# Patient Record
Sex: Female | Born: 2000 | Race: White | Hispanic: No | Marital: Single | Smoking: Former smoker
Health system: Southern US, Community
[De-identification: ages and names within clinical notes are randomized; demographics above are authoritative.]

## PROBLEM LIST (undated history)

## (undated) DIAGNOSIS — F419 Anxiety disorder, unspecified: Secondary | ICD-10-CM

## (undated) HISTORY — PX: NO PAST SURGERIES: SHX2092

---
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
## 2020-01-08 ENCOUNTER — Ambulatory Visit (INDEPENDENT_AMBULATORY_CARE_PROVIDER_SITE_OTHER): Payer: Self-pay | Admitting: Otolaryngology

## 2020-01-08 DIAGNOSIS — J0391 Acute recurrent tonsillitis, unspecified: Secondary | ICD-10-CM

## 2020-01-08 NOTE — H&P (Signed)
PREOPERATIVE H&P  Chief Complaint: History of recurrent tonsillitis  HPI: Michelle Merritt is a 19 y.o. female who presents for evaluation of history of recurrent tonsillitis.  Patient was initially supposed to have tonsillectomy performed 2 years ago but this was delayed because of Covid.  She has continued to have history of recurrent sore throats when if she gets sick and is taken to the operating room at this time for tonsillectomy.  Past Medical History:  Diagnosis Date  . Anxiety    No past surgical history on file. Social History   Socioeconomic History  . Marital status: Single    Spouse name: Not on file  . Number of children: Not on file  . Years of education: Not on file  . Highest education level: Not on file  Occupational History  . Not on file  Tobacco Use  . Smoking status: Never Smoker  . Smokeless tobacco: Never Used  Vaping Use  . Vaping Use: Never used  Substance and Sexual Activity  . Alcohol use: Yes    Comment: soc  . Drug use: Not Currently  . Sexual activity: Not on file  Other Topics Concern  . Not on file  Social History Narrative  . Not on file   Social Determinants of Health   Financial Resource Strain:   . Difficulty of Paying Living Expenses: Not on file  Food Insecurity:   . Worried About Programme researcher, broadcasting/film/video in the Last Year: Not on file  . Ran Out of Food in the Last Year: Not on file  Transportation Needs:   . Lack of Transportation (Medical): Not on file  . Lack of Transportation (Non-Medical): Not on file  Physical Activity:   . Days of Exercise per Week: Not on file  . Minutes of Exercise per Session: Not on file  Stress:   . Feeling of Stress : Not on file  Social Connections:   . Frequency of Communication with Friends and Family: Not on file  . Frequency of Social Gatherings with Friends and Family: Not on file  . Attends Religious Services: Not on file  . Active Member of Clubs or Organizations: Not on file  . Attends  Banker Meetings: Not on file  . Marital Status: Not on file   No family history on file. Allergies  Allergen Reactions  . Amoxicillin Hives  . Penicillins Hives  . Dextromethorphan Other (See Comments)  . Phenylephrine Other (See Comments)   Prior to Admission medications   Medication Sig Start Date End Date Taking? Authorizing Provider  ALPRAZolam (XANAX) 0.5 MG tablet alprazolam 0.5 mg tablet  TAKE 1/2 TO 1 TABLET BY MOUTH EVERY 6 HOURS AS NEEDED ANXIETY    [provider]  citalopram (CELEXA) 10 MG tablet citalopram 10 mg tablet  TAKE 2 TABLETS BY MOUTH EVERY MORNING    [provider]  Levonorgestrel (KYLEENA) 19.5 MG IUD Kyleena 17.5 mcg/24 hrs (70yrs) 19.5mg  intrauterine device  Take 1 device by intrauterine route.    [provider]  propranolol (INDERAL) 20 MG tablet propranolol 20 mg tablet  TAKE 1 TO 2 TABLETS BY MOUTH EVERY 6 HOURS AS NEEDED FOR ANXIETY    [provider]  SUMAtriptan (IMITREX) 100 MG tablet sumatriptan 100 mg tablet  TAKE 1/2 1 TABLET AS NEEDED FOR MIGRAINE. MAY REPEAT IN 2 HRS AS NEEDED DO NOT EXCEED 2 IN 24 HOURSD    [provider]     Positive ROS: Otherwise negative  All other systems have been reviewed and were otherwise negative with the exception of those mentioned in the HPI and as above.  Physical Exam: There were no vitals filed for this visit.  General: Alert, no acute distress Oral: Normal oral mucosa and tonsils 2+ with no acute exudate. Nasal: Clear nasal passages Neck: No palpable adenopathy or thyroid nodules Ear: Ear canal is clear with normal appearing TMs Cardiovascular: Regular rate and rhythm, no murmur.  Respiratory: Clear to auscultation Neurologic: Alert and oriented x 3   Assessment/Plan: Recurrent tonsil infections  Plan for tonsillectomy   [name], MD 01/08/2020 4:22 PM

## 2020-01-08 NOTE — H&P (View-Only) (Signed)
PREOPERATIVE H&P  Chief Complaint: History of recurrent tonsillitis  HPI: Michelle Merritt is a 19 y.o. female who presents for evaluation of history of recurrent tonsillitis.  Patient was initially supposed to have tonsillectomy performed 2 years ago but this was delayed because of Covid.  She has continued to have history of recurrent sore throats when if she gets sick and is taken to the operating room at this time for tonsillectomy.  Past Medical History:  Diagnosis Date  . Anxiety    No past surgical history on file. Social History   Socioeconomic History  . Marital status: Single    Spouse name: Not on file  . Number of children: Not on file  . Years of education: Not on file  . Highest education level: Not on file  Occupational History  . Not on file  Tobacco Use  . Smoking status: Never Smoker  . Smokeless tobacco: Never Used  Vaping Use  . Vaping Use: Never used  Substance and Sexual Activity  . Alcohol use: Yes    Comment: soc  . Drug use: Not Currently  . Sexual activity: Not on file  Other Topics Concern  . Not on file  Social History Narrative  . Not on file   Social Determinants of Health   Financial Resource Strain:   . Difficulty of Paying Living Expenses: Not on file  Food Insecurity:   . Worried About Programme researcher, broadcasting/film/video in the Last Year: Not on file  . Ran Out of Food in the Last Year: Not on file  Transportation Needs:   . Lack of Transportation (Medical): Not on file  . Lack of Transportation (Non-Medical): Not on file  Physical Activity:   . Days of Exercise per Week: Not on file  . Minutes of Exercise per Session: Not on file  Stress:   . Feeling of Stress : Not on file  Social Connections:   . Frequency of Communication with Friends and Family: Not on file  . Frequency of Social Gatherings with Friends and Family: Not on file  . Attends Religious Services: Not on file  . Active Member of Clubs or Organizations: Not on file  . Attends  Banker Meetings: Not on file  . Marital Status: Not on file   No family history on file. Allergies  Allergen Reactions  . Amoxicillin Hives  . Penicillins Hives  . Dextromethorphan Other (See Comments)  . Phenylephrine Other (See Comments)   Prior to Admission medications   Medication Sig Start Date End Date Taking? Authorizing Provider  ALPRAZolam (XANAX) 0.5 MG tablet alprazolam 0.5 mg tablet  TAKE 1/2 TO 1 TABLET BY MOUTH EVERY 6 HOURS AS NEEDED ANXIETY    [provider]  citalopram (CELEXA) 10 MG tablet citalopram 10 mg tablet  TAKE 2 TABLETS BY MOUTH EVERY MORNING    [provider]  Levonorgestrel (KYLEENA) 19.5 MG IUD Kyleena 17.5 mcg/24 hrs (70yrs) 19.5mg  intrauterine device  Take 1 device by intrauterine route.    [provider]  propranolol (INDERAL) 20 MG tablet propranolol 20 mg tablet  TAKE 1 TO 2 TABLETS BY MOUTH EVERY 6 HOURS AS NEEDED FOR ANXIETY    [provider]  SUMAtriptan (IMITREX) 100 MG tablet sumatriptan 100 mg tablet  TAKE 1/2 1 TABLET AS NEEDED FOR MIGRAINE. MAY REPEAT IN 2 HRS AS NEEDED DO NOT EXCEED 2 IN 24 HOURSD    [provider]     Positive ROS: Otherwise negative  All other systems have been reviewed and were otherwise negative with the exception of those mentioned in the HPI and as above.  Physical Exam: There were no vitals filed for this visit.  General: Alert, no acute distress Oral: Normal oral mucosa and tonsils 2+ with no acute exudate. Nasal: Clear nasal passages Neck: No palpable adenopathy or thyroid nodules Ear: Ear canal is clear with normal appearing TMs Cardiovascular: Regular rate and rhythm, no murmur.  Respiratory: Clear to auscultation Neurologic: Alert and oriented x 3   Assessment/Plan: Recurrent tonsil infections  Plan for tonsillectomy   Azlee Monforte, MD 01/08/2020 4:22 PM  

## 2020-01-10 ENCOUNTER — Other Ambulatory Visit: Payer: Self-pay

## 2020-01-10 ENCOUNTER — Encounter (HOSPITAL_BASED_OUTPATIENT_CLINIC_OR_DEPARTMENT_OTHER): Payer: Self-pay | Admitting: Otolaryngology

## 2020-01-14 ENCOUNTER — Inpatient Hospital Stay (HOSPITAL_COMMUNITY): Admission: RE | Admit: 2020-01-14 | Payer: 59 | Source: Ambulatory Visit

## 2020-01-15 ENCOUNTER — Other Ambulatory Visit (HOSPITAL_COMMUNITY)
Admission: RE | Admit: 2020-01-15 | Discharge: 2020-01-15 | Disposition: A | Discharge status: Home / Self Care | Payer: 59 | Source: Ambulatory Visit | Attending: Otolaryngology | Admitting: Otolaryngology

## 2020-01-15 DIAGNOSIS — Z20822 Contact with and (suspected) exposure to covid-19: Secondary | ICD-10-CM | POA: Insufficient documentation

## 2020-01-15 DIAGNOSIS — Z01812 Encounter for preprocedural laboratory examination: Secondary | ICD-10-CM | POA: Diagnosis present

## 2020-01-15 LAB — SARS CORONAVIRUS 2 (TAT 6-24 HRS): SARS Coronavirus 2: NEGATIVE

## 2020-01-17 ENCOUNTER — Other Ambulatory Visit: Payer: Self-pay

## 2020-01-17 ENCOUNTER — Encounter (HOSPITAL_BASED_OUTPATIENT_CLINIC_OR_DEPARTMENT_OTHER)
Admission: RE | Disposition: A | Discharge status: Home / Self Care | Payer: Self-pay | Source: Home / Self Care | Attending: Otolaryngology

## 2020-01-17 ENCOUNTER — Ambulatory Visit (HOSPITAL_BASED_OUTPATIENT_CLINIC_OR_DEPARTMENT_OTHER)
Admission: RE | Admit: 2020-01-17 | Discharge: 2020-01-17 | Disposition: A | Discharge status: Home / Self Care | Payer: 59 | Attending: Otolaryngology | Admitting: Otolaryngology

## 2020-01-17 ENCOUNTER — Other Ambulatory Visit (INDEPENDENT_AMBULATORY_CARE_PROVIDER_SITE_OTHER): Payer: Self-pay | Admitting: Otolaryngology

## 2020-01-17 ENCOUNTER — Ambulatory Visit (HOSPITAL_BASED_OUTPATIENT_CLINIC_OR_DEPARTMENT_OTHER): Payer: 59 | Admitting: Certified Registered"

## 2020-01-17 ENCOUNTER — Encounter (HOSPITAL_BASED_OUTPATIENT_CLINIC_OR_DEPARTMENT_OTHER): Payer: Self-pay | Admitting: Otolaryngology

## 2020-01-17 DIAGNOSIS — Z79899 Other long term (current) drug therapy: Secondary | ICD-10-CM | POA: Diagnosis not present

## 2020-01-17 DIAGNOSIS — Z975 Presence of (intrauterine) contraceptive device: Secondary | ICD-10-CM | POA: Diagnosis not present

## 2020-01-17 DIAGNOSIS — Z793 Long term (current) use of hormonal contraceptives: Secondary | ICD-10-CM | POA: Insufficient documentation

## 2020-01-17 DIAGNOSIS — Z888 Allergy status to other drugs, medicaments and biological substances status: Secondary | ICD-10-CM | POA: Insufficient documentation

## 2020-01-17 DIAGNOSIS — Z880 Allergy status to penicillin: Secondary | ICD-10-CM | POA: Insufficient documentation

## 2020-01-17 DIAGNOSIS — J3501 Chronic tonsillitis: Secondary | ICD-10-CM | POA: Insufficient documentation

## 2020-01-17 DIAGNOSIS — J0391 Acute recurrent tonsillitis, unspecified: Secondary | ICD-10-CM

## 2020-01-17 HISTORY — PX: TONSILLECTOMY: SHX5217

## 2020-01-17 LAB — POCT PREGNANCY, URINE: Preg Test, Ur: NEGATIVE

## 2020-01-17 SURGERY — TONSILLECTOMY
Anesthesia: General | Site: Mouth

## 2020-01-17 MED ORDER — ONDANSETRON HCL 4 MG/2ML IJ SOLN
INTRAMUSCULAR | Status: DC | PRN
  Administered 2020-01-17: 4 mg via INTRAVENOUS

## 2020-01-17 MED ORDER — CHLORHEXIDINE GLUCONATE CLOTH 2 % EX PADS: 6.0000 | MEDICATED_PAD | Freq: Once | CUTANEOUS | Status: DC

## 2020-01-17 MED ORDER — PROPOFOL 10 MG/ML IV BOLUS
INTRAVENOUS | Status: AC
  Filled 2020-01-17: qty 20

## 2020-01-17 MED ORDER — HYDROCODONE-ACETAMINOPHEN 7.5-325 MG/15ML PO SOLN: 10.0000 mL | Freq: Four times a day (QID) | ORAL | 0 refills | Status: AC | PRN

## 2020-01-17 MED ORDER — HYDROMORPHONE HCL 1 MG/ML IJ SOLN
0.2500 mg | INTRAMUSCULAR | Status: DC | PRN
  Administered 2020-01-17 (×2): 0.25 mg via INTRAVENOUS

## 2020-01-17 MED ORDER — HYDROMORPHONE HCL 1 MG/ML IJ SOLN
INTRAMUSCULAR | Status: AC
  Filled 2020-01-17: qty 0.5

## 2020-01-17 MED ORDER — LACTATED RINGERS IV SOLN: INTRAVENOUS | Status: DC

## 2020-01-17 MED ORDER — ROCURONIUM BROMIDE 100 MG/10ML IV SOLN
INTRAVENOUS | Status: DC | PRN
  Administered 2020-01-17: 60 mg via INTRAVENOUS

## 2020-01-17 MED ORDER — CEFAZOLIN SODIUM-DEXTROSE 2-4 GM/100ML-% IV SOLN
INTRAVENOUS | Status: AC
  Filled 2020-01-17: qty 100

## 2020-01-17 MED ORDER — FENTANYL CITRATE (PF) 100 MCG/2ML IJ SOLN
INTRAMUSCULAR | Status: AC
  Filled 2020-01-17: qty 2

## 2020-01-17 MED ORDER — AMISULPRIDE (ANTIEMETIC) 5 MG/2ML IV SOLN: 10.0000 mg | Freq: Once | INTRAVENOUS | Status: DC | PRN

## 2020-01-17 MED ORDER — MEPERIDINE HCL 25 MG/ML IJ SOLN: 6.2500 mg | INTRAMUSCULAR | Status: DC | PRN

## 2020-01-17 MED ORDER — SUGAMMADEX SODIUM 200 MG/2ML IV SOLN
INTRAVENOUS | Status: DC | PRN
  Administered 2020-01-17: 300 mg via INTRAVENOUS

## 2020-01-17 MED ORDER — LIDOCAINE 2% (20 MG/ML) 5 ML SYRINGE
INTRAMUSCULAR | Status: DC | PRN
  Administered 2020-01-17: 60 mg via INTRAVENOUS

## 2020-01-17 MED ORDER — FENTANYL CITRATE (PF) 100 MCG/2ML IJ SOLN
INTRAMUSCULAR | Status: DC | PRN
  Administered 2020-01-17: 50 ug via INTRAVENOUS
  Administered 2020-01-17: 100 ug via INTRAVENOUS

## 2020-01-17 MED ORDER — OXYCODONE HCL 5 MG PO TABS: 5.0000 mg | ORAL_TABLET | Freq: Once | ORAL | Status: AC | PRN

## 2020-01-17 MED ORDER — OXYCODONE HCL 5 MG/5ML PO SOLN
5.0000 mg | Freq: Once | ORAL | Status: AC | PRN
  Administered 2020-01-17: 5 mg via ORAL

## 2020-01-17 MED ORDER — DEXAMETHASONE SODIUM PHOSPHATE 10 MG/ML IJ SOLN
INTRAMUSCULAR | Status: DC | PRN
  Administered 2020-01-17: 10 mg via INTRAVENOUS

## 2020-01-17 MED ORDER — AZITHROMYCIN 200 MG/5ML PO SUSR: 200.0000 mg | Freq: Every day | ORAL | 0 refills | Status: DC

## 2020-01-17 MED ORDER — CEFAZOLIN SODIUM-DEXTROSE 2-4 GM/100ML-% IV SOLN
2.0000 g | INTRAVENOUS | Status: AC
  Administered 2020-01-17: 2 g via INTRAVENOUS

## 2020-01-17 MED ORDER — PROPOFOL 10 MG/ML IV BOLUS
INTRAVENOUS | Status: DC | PRN
  Administered 2020-01-17: 120 mg via INTRAVENOUS

## 2020-01-17 MED ORDER — MIDAZOLAM HCL 5 MG/5ML IJ SOLN
INTRAMUSCULAR | Status: DC | PRN
  Administered 2020-01-17: 2 mg via INTRAVENOUS

## 2020-01-17 MED ORDER — MIDAZOLAM HCL 2 MG/2ML IJ SOLN
INTRAMUSCULAR | Status: AC
  Filled 2020-01-17: qty 2

## 2020-01-17 MED ORDER — PROMETHAZINE HCL 25 MG/ML IJ SOLN
6.2500 mg | INTRAMUSCULAR | Status: DC | PRN
Start: 2020-01-17 — End: 2020-01-17

## 2020-01-17 MED ORDER — OXYCODONE HCL 5 MG/5ML PO SOLN
ORAL | Status: AC
  Filled 2020-01-17: qty 5

## 2020-01-17 SURGICAL SUPPLY — 29 items
BNDG COHESIVE 2X5 TAN STRL LF (GAUZE/BANDAGES/DRESSINGS) IMPLANT
CANISTER SUCT 1200ML W/VALVE (MISCELLANEOUS) ×3 IMPLANT
CATH ROBINSON RED A/P 12FR (CATHETERS) IMPLANT
COAGULATOR SUCT 6 FR SWTCH (ELECTROSURGICAL)
COAGULATOR SUCT SWTCH 10FR 6 (ELECTROSURGICAL) IMPLANT
COVER BACK TABLE 60X90IN (DRAPES) ×3 IMPLANT
COVER MAYO STAND STRL (DRAPES) ×3 IMPLANT
COVER WAND RF STERILE (DRAPES) IMPLANT
ELECT COATED BLADE 2.86 ST (ELECTRODE) ×3 IMPLANT
ELECT REM PT RETURN 9FT ADLT (ELECTROSURGICAL)
ELECT REM PT RETURN 9FT PED (ELECTROSURGICAL)
ELECTRODE REM PT RETRN 9FT PED (ELECTROSURGICAL) IMPLANT
ELECTRODE REM PT RTRN 9FT ADLT (ELECTROSURGICAL) IMPLANT
GAUZE SPONGE 4X4 12PLY STRL LF (GAUZE/BANDAGES/DRESSINGS) ×3 IMPLANT
GLOVE SS BIOGEL STRL SZ 7.5 (GLOVE) ×1 IMPLANT
GLOVE SUPERSENSE BIOGEL SZ 7.5 (GLOVE) ×2
GOWN STRL REUS W/ TWL LRG LVL3 (GOWN DISPOSABLE) ×1 IMPLANT
GOWN STRL REUS W/TWL LRG LVL3 (GOWN DISPOSABLE) ×3
MARKER SKIN DUAL TIP RULER LAB (MISCELLANEOUS) IMPLANT
NS IRRIG 1000ML POUR BTL (IV SOLUTION) ×3 IMPLANT
PENCIL FOOT CONTROL (ELECTRODE) ×3 IMPLANT
SHEET MEDIUM DRAPE 40X70 STRL (DRAPES) ×3 IMPLANT
SOLUTION BUTLER CLEAR DIP (MISCELLANEOUS) IMPLANT
SPONGE TONSIL TAPE 1 RFD (DISPOSABLE) IMPLANT
SPONGE TONSIL TAPE 1.25 RFD (DISPOSABLE) IMPLANT
SYR BULB EAR ULCER 3OZ GRN STR (SYRINGE) ×3 IMPLANT
TOWEL GREEN STERILE FF (TOWEL DISPOSABLE) ×3 IMPLANT
TUBE CONNECTING 20'X1/4 (TUBING) ×1
TUBE CONNECTING 20X1/4 (TUBING) ×2 IMPLANT

## 2020-01-17 NOTE — Op Note (Signed)
NAMEBALBINA, Michelle Merritt MEDICAL RECORD [id number] ACCOUNT [id number] DATE OF BIRTH:05-13-2000 FACILITY: [facility] LOCATION: [facility] PHYSICIAN:Kenzy Campoverde Braxton Feathers, MD  OPERATIVE REPORT  DATE OF PROCEDURE:  01/17/2020  PREOPERATIVE DIAGNOSIS:  Chronic tonsillitis.  POSTOPERATIVE DIAGNOSIS:  Chronic tonsillitis.  OPERATION PERFORMED:  Tonsillectomy.  SURGEON:  [name], MD.  ANESTHESIA:  General endotracheal.  ESTIMATED BLOOD LOSS:  Minimal.  COMPLICATIONS:  None.  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  Whenever she gets upper respiratory infections, she gets tonsil infections.  She was initially scheduled to have her tonsils removed about 2 years ago,  but this was delayed because of the COVID pandemic.  She still gets sore throats and is taken to the operating room at this time for tonsillectomy.  DESCRIPTION OF PROCEDURE:  After adequate endotracheal anesthesia, the patient received 2 grams of Ancef and 10 mg of Decadron IV preoperatively.  A mouth gag was used to expose the oropharynx.  The patient had a generous sized embedded tonsils  bilaterally that were removed with cautery.  Care was taken to preserve the anterior and posterior tonsillar pillars, as well as uvula.  She had minimal bleeding that was controlled with cautery.  Evaluation of the nasopharynx revealed minimal adenoid  tissue and nothing was done in this region.  Oropharynx was irrigated with saline.  This completed the procedure.  The patient was awoken from anesthesia and transferred to the recovery room, postoperatively doing well.  DISPOSITION:  She is discharged home later this morning on Zithromax suspension 1 teaspoon daily for the next 6 days as well as Tylenol, ibuprofen and hydrocodone elixir 2-3 teaspoons q.4 hours p.r.n. pain.  She will follow up in my office in 2 weeks for  recheck.  HN/NUANCE  D:01/17/2020 T:01/17/2020  JOB:[id number]

## 2020-01-17 NOTE — Anesthesia Preprocedure Evaluation (Signed)
Anesthesia Evaluation  Patient identified by MRN, date of birth, ID band Patient awake    Reviewed: Allergy & Precautions, H&P , NPO status , Patient's Chart, lab work & pertinent test results  Airway Mallampati: II  TM Distance: >3 FB Neck ROM: Full    Dental no notable dental hx.    Pulmonary neg pulmonary ROS,    Pulmonary exam normal breath sounds clear to auscultation       Cardiovascular negative cardio ROS Normal cardiovascular exam Rhythm:Regular Rate:Normal     Neuro/Psych Anxiety negative neurological ROS  negative psych ROS   GI/Hepatic negative GI ROS, Neg liver ROS,   Endo/Other  negative endocrine ROS  Renal/GU negative Renal ROS  negative genitourinary   Musculoskeletal negative musculoskeletal ROS (+)   Abdominal   Peds negative pediatric ROS (+)  Hematology negative hematology ROS (+)   Anesthesia Other Findings   Reproductive/Obstetrics negative OB ROS                             Anesthesia Physical Anesthesia Plan  ASA: II  Anesthesia Plan: General   Post-op Pain Management:    Induction: Intravenous  PONV Risk Score and Plan: 3 and Ondansetron, Dexamethasone, Midazolam and Treatment may vary due to age or medical condition  Airway Management Planned: Oral ETT  Additional Equipment:   Intra-op Plan:   Post-operative Plan: Extubation in OR  Informed Consent: I have reviewed the patients History and Physical, chart, labs and discussed the procedure including the risks, benefits and alternatives for the proposed anesthesia with the patient or authorized representative who has indicated his/her understanding and acceptance.     Dental advisory given  Plan Discussed with: CRNA  Anesthesia Plan Comments:         Anesthesia Quick Evaluation

## 2020-01-17 NOTE — Anesthesia Procedure Notes (Signed)
Procedure Name: Intubation Date/Time: 01/17/2020 7:43 AM Performed by: Lavonia Dana, CRNA Pre-anesthesia Checklist: Patient identified, Emergency Drugs available, Suction available and Patient being monitored Patient Re-evaluated:Patient Re-evaluated prior to induction Oxygen Delivery Method: Circle system utilized Preoxygenation: Pre-oxygenation with 100% oxygen Induction Type: IV induction Ventilation: Mask ventilation without difficulty Laryngoscope Size: Mac and 3 Grade View: Grade I Tube type: Oral Tube size: 7.0 mm Number of attempts: 1 Airway Equipment and Method: Stylet and Oral airway Placement Confirmation: ETT inserted through vocal cords under direct vision,  positive ETCO2 and breath sounds checked- equal and bilateral Secured at: 21 cm Tube secured with: Tape Dental Injury: Teeth and Oropharynx as per pre-operative assessment

## 2020-01-17 NOTE — Anesthesia Postprocedure Evaluation (Signed)
Anesthesia Post Note  Patient: Michelle Merritt  Procedure(s) Performed: TONSILLECTOMY (N/A Mouth)     Patient location during evaluation: PACU Anesthesia Type: General Level of consciousness: awake and alert Pain management: pain level controlled Vital Signs Assessment: post-procedure vital signs reviewed and stable Respiratory status: spontaneous breathing, nonlabored ventilation and respiratory function stable Cardiovascular status: blood pressure returned to baseline and stable Postop Assessment: no apparent nausea or vomiting Anesthetic complications: no   No complications documented.  Last Vitals:  Vitals:   01/17/20 0830 01/17/20 0845  BP: 122/83 112/70  Pulse: 80 74  Resp: 10 16  Temp:  36.7 C  SpO2: 100% 100%    Last Pain:  Vitals:   01/17/20 0845  TempSrc:   PainSc: 4                  Lowella Curb

## 2020-01-17 NOTE — Brief Op Note (Signed)
01/17/2020  8:08 AM  PATIENT:  Michelle Merritt  18 y.o. female  PRE-OPERATIVE DIAGNOSIS:  CHRONIC TONSILITIS  POST-OPERATIVE DIAGNOSIS:  CHRONIC TONSILITIS  PROCEDURE:  Procedure(s): TONSILLECTOMY (N/A)  SURGEON:  Surgeon(s) and Role:    Drema Halon, MD - Primary  PHYSICIAN ASSISTANT:   ASSISTANTS: none   ANESTHESIA:   general  EBL:  10 mL   BLOOD ADMINISTERED:none  DRAINS: none   LOCAL MEDICATIONS USED:  NONE  SPECIMEN:  No Specimen  DISPOSITION OF SPECIMEN:  N/A  COUNTS:  YES  TOURNIQUET:  * No tourniquets in log *  DICTATION: .Other Dictation: Dictation Number [id number]  PLAN OF CARE: Discharge to home after PACU  PATIENT DISPOSITION:  PACU - hemodynamically stable.   Delay start of Pharmacological VTE agent (>24hrs) due to surgical blood loss or risk of bleeding: yes

## 2020-01-17 NOTE — Discharge Instructions (Signed)
Instructions for Home Care After Tonsillectomy  First Day Home: Encourage fluid intake by frequently offering liquids, soup, ice cream jello, etc.  Drink several glasses of water.  Cooler fluids are best.  Avoid hot and highly seasoned foods.  Orange juice, grapefruit juice and tomato juice may cause stinging sensation because of their acidic content.    Second and Third Day Home: Continue liquids and add soft foods, (pudding, macaroni and cheese, mashed potatoes, soft scrambled eggs, etc.).  Make sure you drink plenty of liquids so you do not get dehydrated.  Fifth Thru Seventh Day Home: Gradually resume a normal diet, but avoid hot foods, potato chips, nuts, toast and crackers until 2 weeks after surgery.  General Instructions   No undue physical exertion or exercise for one week.  Children: Tylenol may be used for discomfort and/or fever.  Use as often as necessary within limits of the directions.  Adults: May spray throat with Chloroseptic or other topical anesthetic for discomfort and use pain medication obtained by prescription as directed.    A slight fever (up to 101) is expected for the first the first couple of days.  Take Tylenol (or aspirin substitute) as directed.  Pain in ears is common after tonsillectomy.  It represents pain referred from the throat where the tonsils were removed.  There is usually nothing wrong with the ears in most cases.  Administer Tylenol as needed to control this pain.  White patches will form where the tonsils were removed.  This is perfectly normal.  They will disappear in one to two weeks.  Mouth odor may be notice during the healing stage.  In a very small percentage of people, there is some bleeding after five to six days.  If this happens, do not become excited, for the bleeding is usually light.  Be quiet, lie down, and spit the blood out gently.  Gargle the throat with ice water.  If the bleeding does not stop promptly, call the office  [phone]), which answers 24 hours a day.  A follow up appointment should be made with Dr. Ezzard Standing 16-20 days following surgery. Please call [phone] for the appointment time.  Tylenol, ibuprofen, or hydrocodone elixir 2-3 tsp (10-15 cc) every 6 hrs prn pain Zithromax susp daily for 6 days Can use throat lozenges or spray for sore throat but drink plenty of  Liquids  Post Anesthesia Home Care Instructions  Activity: Get plenty of rest for the remainder of the day. A responsible individual must stay with you for 24 hours following the procedure.  For the next 24 hours, DO NOT: -Drive a car -Advertising copywriter -Drink alcoholic beverages -Take any medication unless instructed by your physician -Make any legal decisions or sign important papers.  Meals: Start with liquid foods such as gelatin or soup. Progress to regular foods as tolerated. Avoid greasy, spicy, heavy foods. If nausea and/or vomiting occur, drink only clear liquids until the nausea and/or vomiting subsides. Call your physician if vomiting continues.  Special Instructions/Symptoms: Your throat may feel dry or sore from the anesthesia or the breathing tube placed in your throat during surgery. If this causes discomfort, gargle with warm salt water. The discomfort should disappear within 24 hours.  If you had a scopolamine patch placed behind your ear for the management of post- operative nausea and/or vomiting:  1. The medication in the patch is effective for 72 hours, after which it should be removed.  Wrap patch in a tissue and discard in the  trash. Wash hands thoroughly with soap and water. 2. You may remove the patch earlier than 72 hours if you experience unpleasant side effects which may include dry mouth, dizziness or visual disturbances. 3. Avoid touching the patch. Wash your hands with soap and water after contact with the patch.

## 2020-01-17 NOTE — Interval H&P Note (Signed)
History and Physical Interval Note:  01/17/2020 7:33 AM  Michelle Merritt  has presented today for surgery, with the diagnosis of CHRONIC TONSILITIS.  The various methods of treatment have been discussed with the patient and family. After consideration of risks, benefits and other options for treatment, the patient has consented to  Procedure(s): TONSILLECTOMY (N/A) as a surgical intervention.  The patient's history has been reviewed, patient examined, no change in status, stable for surgery.  I have reviewed the patient's chart and labs.  Questions were answered to the patient's satisfaction.     [name]

## 2020-01-17 NOTE — Transfer of Care (Signed)
Immediate Anesthesia Transfer of Care Note  Patient: Michelle Merritt  Procedure(s) Performed: TONSILLECTOMY (N/A Mouth)  Patient Location: PACU  Anesthesia Type:General  Level of Consciousness: awake, alert  and oriented  Airway & Oxygen Therapy: Patient Spontanous Breathing and Patient connected to face mask oxygen  Post-op Assessment: Report given to RN and Post -op Vital signs reviewed and stable  Post vital signs: Reviewed and stable  Last Vitals:  Vitals Value Taken Time  BP 120/84 01/17/20 0815  Temp    Pulse 87 01/17/20 0817  Resp 19 01/17/20 0817  SpO2 100 % 01/17/20 0817  Vitals shown include unvalidated device data.  Last Pain:  Vitals:   01/17/20 0630  TempSrc: Oral  PainSc: 5       Patients Stated Pain Goal: 2 (01/17/20 0630)  Complications: No complications documented.

## 2020-01-20 ENCOUNTER — Encounter (HOSPITAL_BASED_OUTPATIENT_CLINIC_OR_DEPARTMENT_OTHER): Payer: Self-pay | Admitting: Otolaryngology

## 2020-01-21 ENCOUNTER — Other Ambulatory Visit (INDEPENDENT_AMBULATORY_CARE_PROVIDER_SITE_OTHER): Payer: Self-pay | Admitting: Otolaryngology

## 2020-01-21 MED ORDER — HYDROCODONE-ACETAMINOPHEN 7.5-325 MG/15ML PO SOLN: 10.0000 mL | Freq: Four times a day (QID) | ORAL | 0 refills | Status: AC | PRN

## 2020-02-07 ENCOUNTER — Other Ambulatory Visit: Payer: Self-pay

## 2020-02-07 ENCOUNTER — Ambulatory Visit (INDEPENDENT_AMBULATORY_CARE_PROVIDER_SITE_OTHER): Payer: 59 | Admitting: Otolaryngology

## 2020-02-07 ENCOUNTER — Encounter (INDEPENDENT_AMBULATORY_CARE_PROVIDER_SITE_OTHER): Payer: Self-pay | Admitting: Otolaryngology

## 2020-02-07 VITALS — Temp 97.7°F

## 2020-02-07 DIAGNOSIS — Z4889 Encounter for other specified surgical aftercare: Secondary | ICD-10-CM

## 2020-02-07 NOTE — Progress Notes (Signed)
HPI: Michelle Merritt is a 20 y.o. female who presents 3 weeks days s/p tonsillectomy.  She has some bleeding 1 week following tonsillectomy on Christmas Eve.  She is done well since then.  Has only been able to talk well over the past week.  She is back to close to normal...   Past Medical History:  Diagnosis Date  . Anxiety    Past Surgical History:  Procedure Laterality Date  . NO PAST SURGERIES    . TONSILLECTOMY N/A 01/17/2020   Procedure: TONSILLECTOMY;  Surgeon: Drema Halon, MD;  Location: [facility];  Service: ENT;  Laterality: N/A;   Social History   Socioeconomic History  . Marital status: Single    Spouse name: Not on file  . Number of children: Not on file  . Years of education: Not on file  . Highest education level: Not on file  Occupational History  . Not on file  Tobacco Use  . Smoking status: Former Smoker    Years: 1.00    Types: E-cigarettes    Start date: 2021    Quit date: 2022    Years since quitting: 0.0  . Smokeless tobacco: Never Used  . Tobacco comment: some days smoker  Vaping Use  . Vaping Use: Some days  Substance and Sexual Activity  . Alcohol use: Not Currently    Comment: soc  . Drug use: Not Currently    Types: Marijuana  . Sexual activity: Not on file  Other Topics Concern  . Not on file  Social History Narrative  . Not on file   Social Determinants of Health   Financial Resource Strain: Not on file  Food Insecurity: Not on file  Transportation Needs: Not on file  Physical Activity: Not on file  Stress: Not on file  Social Connections: Not on file   No family history on file. Allergies  Allergen Reactions  . Amoxicillin Hives  . Penicillins Hives  . Dextromethorphan Other (See Comments)  . Phenylephrine Other (See Comments)   Prior to Admission medications   Medication Sig Start Date End Date Taking? Authorizing Provider  ALPRAZolam (XANAX) 0.5 MG tablet alprazolam 0.5 mg tablet  TAKE 1/2 TO 1  TABLET BY MOUTH EVERY 6 HOURS AS NEEDED ANXIETY    [provider]  HYDROcodone-acetaminophen (HYCET) 7.5-325 mg/15 ml solution Take 10-15 mLs by mouth every 6 (six) hours as needed for moderate pain. 01/17/20 01/16/21  Drema Halon, MD  HYDROcodone-acetaminophen (HYCET) 7.5-325 mg/15 ml solution Take 10-15 mLs by mouth every 6 (six) hours as needed for moderate pain. 01/21/20 01/20/21  Drema Halon, MD  HYDROcodone-acetaminophen (HYCET) 7.5-325 mg/15 ml solution Take 10-15 mLs by mouth every 6 (six) hours as needed for moderate pain. 01/21/20 01/20/21  Drema Halon, MD  Levonorgestrel [facility]) 19.5 MG IUD Kyleena 17.5 mcg/24 hrs (18yrs) 19.5mg  intrauterine device  Take 1 device by intrauterine route.    [provider]  NORGESTIMATE-ETH ESTRADIOL PO Take by mouth.    [provider]  propranolol (INDERAL) 20 MG tablet propranolol 20 mg tablet  TAKE 1 TO 2 TABLETS BY MOUTH EVERY 6 HOURS AS NEEDED FOR ANXIETY    [provider]  SUMAtriptan (IMITREX) 100 MG tablet sumatriptan 100 mg tablet  TAKE 1/2 1 TABLET AS NEEDED FOR MIGRAINE. MAY REPEAT IN 2 HRS AS NEEDED DO NOT EXCEED 2 IN 34 HOURSD    [provider]     Physical Exam: Tonsil fossa's are healing  nicely she still has a minor amount of white scabbing but no blood clot and everything seems to be healing nicely.   Assessment: S/p tonsillectomy  Plan: She will follow-up as needed.   Narda Bonds, MD

## 2020-08-10 ENCOUNTER — Other Ambulatory Visit: Payer: Self-pay | Admitting: Family Medicine

## 2020-08-10 ENCOUNTER — Other Ambulatory Visit: Payer: Self-pay

## 2020-08-10 ENCOUNTER — Ambulatory Visit
Admission: RE | Admit: 2020-08-10 | Discharge: 2020-08-10 | Disposition: A | Discharge status: Home / Self Care | Payer: 59 | Source: Ambulatory Visit | Attending: Family Medicine | Admitting: Family Medicine

## 2020-08-10 DIAGNOSIS — M25572 Pain in left ankle and joints of left foot: Secondary | ICD-10-CM

## 2020-08-10 DIAGNOSIS — R609 Edema, unspecified: Secondary | ICD-10-CM

## 2020-09-01 ENCOUNTER — Ambulatory Visit (INDEPENDENT_AMBULATORY_CARE_PROVIDER_SITE_OTHER): Payer: 59 | Admitting: Psychiatry

## 2020-09-01 ENCOUNTER — Other Ambulatory Visit: Payer: Self-pay

## 2020-09-01 DIAGNOSIS — Z91410 Personal history of adult physical and sexual abuse: Secondary | ICD-10-CM | POA: Diagnosis not present

## 2020-09-01 DIAGNOSIS — F1211 Cannabis abuse, in remission: Secondary | ICD-10-CM | POA: Diagnosis not present

## 2020-09-01 DIAGNOSIS — F4001 Agoraphobia with panic disorder: Secondary | ICD-10-CM

## 2020-09-01 DIAGNOSIS — F429 Obsessive-compulsive disorder, unspecified: Secondary | ICD-10-CM
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
## 2020-09-15 ENCOUNTER — Encounter: Payer: Self-pay | Admitting: Behavioral Health

## 2020-09-15 ENCOUNTER — Ambulatory Visit (INDEPENDENT_AMBULATORY_CARE_PROVIDER_SITE_OTHER): Payer: 59 | Admitting: Behavioral Health

## 2020-09-15 ENCOUNTER — Other Ambulatory Visit: Payer: Self-pay

## 2020-09-15 VITALS — BP 120/70 | HR 83 | Ht 65.0 in | Wt 137.0 lb

## 2020-09-15 DIAGNOSIS — F4001 Agoraphobia with panic disorder: Secondary | ICD-10-CM

## 2020-09-15 DIAGNOSIS — F411 Generalized anxiety disorder: Secondary | ICD-10-CM

## 2020-09-15 DIAGNOSIS — F429 Obsessive-compulsive disorder, unspecified: Secondary | ICD-10-CM | POA: Diagnosis not present

## 2020-09-15 DIAGNOSIS — Z91410 Personal history of adult physical and sexual abuse: Secondary | ICD-10-CM

## 2020-09-15 DIAGNOSIS — F3281 Premenstrual dysphoric disorder: Secondary | ICD-10-CM

## 2020-09-15 DIAGNOSIS — F1211 Cannabis abuse, in remission: Secondary | ICD-10-CM

## 2020-09-15 MED ORDER — BUSPIRONE HCL 15 MG PO TABS: ORAL_TABLET | ORAL | 2 refills | Status: DC

## 2020-09-15 MED ORDER — PROPRANOLOL HCL 20 MG PO TABS: 20.0000 mg | ORAL_TABLET | Freq: Three times a day (TID) | ORAL | 2 refills | Status: DC

## 2020-09-15 MED ORDER — VENLAFAXINE HCL ER 75 MG PO CP24: 75.0000 mg | ORAL_CAPSULE | Freq: Every day | ORAL | 2 refills | Status: DC
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
## 2020-09-25 ENCOUNTER — Other Ambulatory Visit: Payer: Self-pay

## 2020-09-25 ENCOUNTER — Ambulatory Visit (INDEPENDENT_AMBULATORY_CARE_PROVIDER_SITE_OTHER): Payer: 59 | Admitting: Psychiatry

## 2020-09-25 DIAGNOSIS — F4001 Agoraphobia with panic disorder: Secondary | ICD-10-CM | POA: Diagnosis not present

## 2020-09-25 DIAGNOSIS — F1211 Cannabis abuse, in remission: Secondary | ICD-10-CM

## 2020-09-25 DIAGNOSIS — F429 Obsessive-compulsive disorder, unspecified: Secondary | ICD-10-CM | POA: Diagnosis not present

## 2020-09-25 DIAGNOSIS — F3281 Premenstrual dysphoric disorder: Secondary | ICD-10-CM | POA: Diagnosis not present

## 2020-09-25 DIAGNOSIS — Z91410 Personal history of adult physical and sexual abuse: Secondary | ICD-10-CM
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
## 2020-10-08 ENCOUNTER — Other Ambulatory Visit: Payer: Self-pay | Admitting: Behavioral Health

## 2020-10-08 DIAGNOSIS — F411 Generalized anxiety disorder: Secondary | ICD-10-CM

## 2020-10-08 DIAGNOSIS — F429 Obsessive-compulsive disorder, unspecified: Secondary | ICD-10-CM

## 2020-10-08 DIAGNOSIS — F4001 Agoraphobia with panic disorder: Secondary | ICD-10-CM

## 2020-10-09 ENCOUNTER — Other Ambulatory Visit: Payer: Self-pay

## 2020-10-09 ENCOUNTER — Ambulatory Visit (INDEPENDENT_AMBULATORY_CARE_PROVIDER_SITE_OTHER): Payer: 59 | Admitting: Psychiatry

## 2020-10-09 ENCOUNTER — Ambulatory Visit (INDEPENDENT_AMBULATORY_CARE_PROVIDER_SITE_OTHER): Payer: 59 | Admitting: Behavioral Health

## 2020-10-09 DIAGNOSIS — F331 Major depressive disorder, recurrent, moderate: Secondary | ICD-10-CM | POA: Diagnosis not present

## 2020-10-09 DIAGNOSIS — F1211 Cannabis abuse, in remission: Secondary | ICD-10-CM | POA: Diagnosis not present

## 2020-10-09 DIAGNOSIS — Z91410 Personal history of adult physical and sexual abuse: Secondary | ICD-10-CM

## 2020-10-09 DIAGNOSIS — F4001 Agoraphobia with panic disorder: Secondary | ICD-10-CM | POA: Diagnosis not present

## 2020-10-09 DIAGNOSIS — F429 Obsessive-compulsive disorder, unspecified: Secondary | ICD-10-CM

## 2020-10-09 DIAGNOSIS — R45851 Suicidal ideations: Secondary | ICD-10-CM

## 2020-10-09 DIAGNOSIS — F411 Generalized anxiety disorder: Secondary | ICD-10-CM | POA: Diagnosis not present

## 2020-10-09 DIAGNOSIS — F3281 Premenstrual dysphoric disorder: Secondary | ICD-10-CM

## 2020-10-09 MED ORDER — VENLAFAXINE HCL 100 MG PO TABS
100.0000 mg | ORAL_TABLET | Freq: Every day | ORAL | 2 refills | Status: DC
Start: 2020-10-09 — End: 2020-10-23

## 2020-10-09 MED ORDER — LITHIUM CARBONATE 150 MG PO CAPS: 300.0000 mg | ORAL_CAPSULE | Freq: Every evening | ORAL | 1 refills | Status: DC

## 2020-10-09 MED ORDER — BUSPIRONE HCL 15 MG PO TABS: ORAL_TABLET | ORAL | 2 refills | Status: DC
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
## 2020-10-12 ENCOUNTER — Encounter: Payer: Self-pay | Admitting: Behavioral Health

## 2020-10-23 ENCOUNTER — Encounter: Payer: Self-pay | Admitting: Behavioral Health

## 2020-10-23 ENCOUNTER — Ambulatory Visit (INDEPENDENT_AMBULATORY_CARE_PROVIDER_SITE_OTHER): Payer: 59 | Admitting: Psychiatry

## 2020-10-23 ENCOUNTER — Ambulatory Visit (INDEPENDENT_AMBULATORY_CARE_PROVIDER_SITE_OTHER): Payer: 59 | Admitting: Behavioral Health

## 2020-10-23 ENCOUNTER — Other Ambulatory Visit: Payer: Self-pay

## 2020-10-23 DIAGNOSIS — F429 Obsessive-compulsive disorder, unspecified: Secondary | ICD-10-CM | POA: Diagnosis not present

## 2020-10-23 DIAGNOSIS — F411 Generalized anxiety disorder: Secondary | ICD-10-CM

## 2020-10-23 DIAGNOSIS — Z91410 Personal history of adult physical and sexual abuse: Secondary | ICD-10-CM | POA: Diagnosis not present

## 2020-10-23 DIAGNOSIS — F331 Major depressive disorder, recurrent, moderate: Secondary | ICD-10-CM | POA: Diagnosis not present

## 2020-10-23 DIAGNOSIS — R45851 Suicidal ideations: Secondary | ICD-10-CM

## 2020-10-23 DIAGNOSIS — F4001 Agoraphobia with panic disorder: Secondary | ICD-10-CM | POA: Diagnosis not present

## 2020-10-23 DIAGNOSIS — F3281 Premenstrual dysphoric disorder: Secondary | ICD-10-CM

## 2020-10-23 DIAGNOSIS — F1211 Cannabis abuse, in remission: Secondary | ICD-10-CM

## 2020-10-23 MED ORDER — VENLAFAXINE HCL 100 MG PO TABS: 100.0000 mg | ORAL_TABLET | Freq: Every day | ORAL | 2 refills | Status: DC

## 2020-10-23 MED ORDER — LITHIUM CARBONATE 150 MG PO CAPS: 300.0000 mg | ORAL_CAPSULE | Freq: Every evening | ORAL | 2 refills | Status: DC

## 2020-10-23 NOTE — Progress Notes (Signed)
[facility]  Patient ID: Michelle Merritt,  MRN: [id number]  PCP: Mosetta Putt, MD  Date of Evaluation: 10/23/2020 Time spent:20 minutes  Chief Complaint:  Chief Complaint   Anxiety; Depression; Follow-up; Medication Refill; Trauma     HISTORY/CURRENT STATUS: HPI 20 year old female presents to this office for follow up and medication management. She is smiling and pleasant. She appears less anxious and is not tearful. She says that she thinks the lithium has helped her. She says that she has experienced significant decrease in suicidal ideation as well as not contemplating any plans for self harm. Says that she turned over sharp instruments and xanax to boyfriend that she says she trust. She does not want to rely or take anymore xanax if she can. She understands that we need to wait a couple more weeks to reevaluate dosage on Effexor or Lithium.  She feels safe at this time and has verbally contracted for safety. Says she does have some break through anxiety and depression but not severe. She does agree to small increase in Buspar for anxiety. Says her anxiety today is 4/10 and depression 3/10.  She is sleeping 7 plus hours at night. S/I is sporadic but without plan. No HI. No mania, no psychosis.   Past psychiatric medication trials Sertraline Fluoxetine Citalopram  Xanax  Individual Medical History/ Review of Systems: Changes? :No   Allergies: Amoxicillin, Penicillins, Dextromethorphan, and Phenylephrine  Current Medications:  Current Outpatient Medications:    ALPRAZolam (XANAX) 0.5 MG tablet, alprazolam 0.5 mg tablet  TAKE 1/2 TO 1 TABLET BY MOUTH EVERY 6 HOURS AS NEEDED ANXIETY, Disp: , Rfl:    ALPRAZolam (XANAX) 0.5 MG tablet, alprazolam 0.5 mg tablet   1 mg 4 times a day by oral route., Disp: , Rfl:    busPIRone (BUSPAR) 15 MG tablet, Take one tablet in the am and one after dinner., Disp: 60 tablet, Rfl: 2   HYDROcodone-acetaminophen (HYCET) 7.5-325  mg/15 ml solution, Take 10-15 mLs by mouth every 6 (six) hours as needed for moderate pain., Disp: 300 mL, Rfl: 0   HYDROcodone-acetaminophen (HYCET) 7.5-325 mg/15 ml solution, Take 10-15 mLs by mouth every 6 (six) hours as needed for moderate pain., Disp: 240 mL, Rfl: 0   HYDROcodone-acetaminophen (HYCET) 7.5-325 mg/15 ml solution, Take 10-15 mLs by mouth every 6 (six) hours as needed for moderate pain., Disp: 240 mL, Rfl: 0   Levonorgestrel (KYLEENA) 19.5 MG IUD, Kyleena 17.5 mcg/24 hrs (44yrs) 19.5mg  intrauterine device  Take 1 device by intrauterine route., Disp: , Rfl:    lithium carbonate 150 MG capsule, Take 2 capsules (300 mg total) by mouth at bedtime., Disp: 60 capsule, Rfl: 1   NORGESTIMATE-ETH ESTRADIOL PO, Take by mouth., Disp: , Rfl:    PROAIR RESPICLICK 108 (90 Base) MCG/ACT AEPB, Inhale 2 puffs into the lungs every 4 (four) hours as needed., Disp: , Rfl:    propranolol (INDERAL) 20 MG tablet, propranolol 20 mg tablet  TAKE 1 TO 2 TABLETS BY MOUTH EVERY 6 HOURS AS NEEDED FOR ANXIETY, Disp: , Rfl:    propranolol (INDERAL) 20 MG tablet, Take 1 tablet (20 mg total) by mouth 3 (three) times daily., Disp: 90 tablet, Rfl: 2   SUMAtriptan (IMITREX) 100 MG tablet, sumatriptan 100 mg tablet  TAKE 1/2 1 TABLET AS NEEDED FOR MIGRAINE. MAY REPEAT IN 2 HRS AS NEEDED DO NOT EXCEED 2 IN 24 HOURSD, Disp: , Rfl:    venlafaxine (EFFEXOR) 100 MG tablet, Take 1 tablet (100  mg total) by mouth daily after breakfast., Disp: 30 tablet, Rfl: 2   venlafaxine XR (EFFEXOR XR) 75 MG 24 hr capsule, Take 1 capsule (75 mg total) by mouth daily with breakfast., Disp: 30 capsule, Rfl: 2   venlafaxine XR (EFFEXOR-XR) 75 MG 24 hr capsule, venlafaxine ER 75 mg capsule,extended release 24 hr   75 mg every day by oral route., Disp: , Rfl:  Medication Side Effects: none  Family Medical/ Social History: Changes? No  MENTAL HEALTH EXAM:  There were no vitals taken for this visit.There is no height or weight on file to  calculate BMI.  General Appearance: Casual, Neat, and Well Groomed  Eye Contact:  Good  Speech:  Clear and Coherent  Volume:  Normal  Mood:  Anxious, Depressed, and Dysphoric  Affect:  Appropriate  Thought Process:   normal  Orientation:  Full (Time, Place, and Person)  Thought Content: Logical   Suicidal Thoughts:  Yes.  without intent/plan  Homicidal Thoughts:  No  Memory:  WNL  Judgement:  Good  Insight:  Good  Psychomotor Activity:  Normal  Concentration:  Concentration: Good  Recall:  Good  Fund of Knowledge: Good  Language: Good  Assets:  Desire for Improvement  ADL's:  Intact  Cognition: WNL  Prognosis:  Good    DIAGNOSES:    ICD-10-CM   1. Panic disorder with agoraphobia  F40.01     2. Obsessive-compulsive disorder, unspecified type  F42.9     3. Generalized anxiety disorder  F41.1     4. PMDD (premenstrual dysphoric disorder)  F32.81     5. Suicidal ideation  R45.851     6. Major depressive disorder, recurrent episode, moderate (HCC)  F33.1     7. 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       Receiving Psychotherapy: Yes    RECOMMENDATIONS:  Continue Lithium 300 mg daily at bedtime. Increase Venlaxafine 100 mg daily. Increase BuSpar 15 mg three times daily for anxiety.  Continue Inderal 20 mg twice daily as needed. Will report worsening symptoms promptly. Follow up in 6 weeks to reassess. Pt instructed to call if needed sooner.  Reinforced after hours emergency number and suicide hotline.  Greater than 50% of 20 min face to face time with patient was spent on counseling and coordination of care. We discussed continued symptoms of anxiety, ocd, PMDD, and stressors causing exacerbation of symptoms.  Discussed future medication options. Only been two weeks since increasing dosage of Effexor. She will call in for progress in two more weeks and determine if we need more adjustments with Effexor or lithium.  She is more stable this visit with substantially  decreased suicidal ideation. She says it crosses her mind occasionally but is not contemplating any plans. She verbally contracted for safety and agreed to turn over xanax to her boyfriend and also surrendered her sharps and knives.   Reviewed PDMP    Joan Flores, NP
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
## 2020-10-31 ENCOUNTER — Other Ambulatory Visit: Payer: Self-pay | Admitting: Behavioral Health

## 2020-10-31 DIAGNOSIS — F4001 Agoraphobia with panic disorder: Secondary | ICD-10-CM

## 2020-10-31 DIAGNOSIS — F411 Generalized anxiety disorder: Secondary | ICD-10-CM

## 2020-11-05 ENCOUNTER — Telehealth: Payer: Self-pay | Admitting: Behavioral Health

## 2020-11-05 ENCOUNTER — Other Ambulatory Visit: Payer: Self-pay

## 2020-11-05 DIAGNOSIS — F429 Obsessive-compulsive disorder, unspecified: Secondary | ICD-10-CM

## 2020-11-05 DIAGNOSIS — F4001 Agoraphobia with panic disorder: Secondary | ICD-10-CM

## 2020-11-05 DIAGNOSIS — R45851 Suicidal ideations: Secondary | ICD-10-CM

## 2020-11-05 DIAGNOSIS — F411 Generalized anxiety disorder: Secondary | ICD-10-CM

## 2020-11-05 MED ORDER — LITHIUM CARBONATE 150 MG PO CAPS: 300.0000 mg | ORAL_CAPSULE | Freq: Every evening | ORAL | 0 refills | Status: DC

## 2020-11-05 NOTE — Telephone Encounter (Signed)
Sent!

## 2020-11-05 NOTE — Telephone Encounter (Signed)
Patient Michelle Merritt called to request a refill on the Lithium. Fill at the [facility] [address] [place], [place] - [address]  [address] [name] [address]  Phone:  [phone]  Fax:  [phone] . A follow up appointment is scheduled for 11/4.

## 2020-11-06 ENCOUNTER — Ambulatory Visit: Payer: 59 | Admitting: Psychiatry

## 2020-11-20 ENCOUNTER — Ambulatory Visit (INDEPENDENT_AMBULATORY_CARE_PROVIDER_SITE_OTHER): Payer: 59 | Admitting: Psychiatry

## 2020-11-20 ENCOUNTER — Other Ambulatory Visit: Payer: Self-pay

## 2020-11-20 ENCOUNTER — Telehealth: Payer: Self-pay | Admitting: Behavioral Health

## 2020-11-20 DIAGNOSIS — F411 Generalized anxiety disorder: Secondary | ICD-10-CM | POA: Diagnosis not present

## 2020-11-20 DIAGNOSIS — Z91410 Personal history of adult physical and sexual abuse: Secondary | ICD-10-CM

## 2020-11-20 DIAGNOSIS — R45851 Suicidal ideations: Secondary | ICD-10-CM | POA: Diagnosis not present

## 2020-11-20 DIAGNOSIS — F331 Major depressive disorder, recurrent, moderate: Secondary | ICD-10-CM | POA: Diagnosis not present

## 2020-11-20 DIAGNOSIS — F4001 Agoraphobia with panic disorder: Secondary | ICD-10-CM | POA: Diagnosis not present

## 2020-11-20 DIAGNOSIS — F1211 Cannabis abuse, in remission: Secondary | ICD-10-CM
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
## 2020-11-20 NOTE — Telephone Encounter (Signed)
Please advise patient that I will need to see her before increasing medication like lithium. If it is urgent,and she can call to see if she can get sooner appointment.

## 2020-11-20 NOTE — Telephone Encounter (Signed)
Please review

## 2020-11-20 NOTE — Telephone Encounter (Signed)
Pt stopped by upon checking out with visit with [name].  She said that [name] wants Michelle Merritt to see if she may need to increase her Effexor and Lithium.  Next appt 11/4

## 2020-11-23 NOTE — Telephone Encounter (Signed)
Please call pt with info

## 2020-11-23 NOTE — Telephone Encounter (Signed)
Called patient and gave her the info. She was ok to leave her 11/4 appt as it and not try to schedule an earlier appt.

## 2020-11-29 ENCOUNTER — Other Ambulatory Visit: Payer: Self-pay | Admitting: Behavioral Health

## 2020-11-29 DIAGNOSIS — F4001 Agoraphobia with panic disorder: Secondary | ICD-10-CM

## 2020-11-29 DIAGNOSIS — F411 Generalized anxiety disorder: Secondary | ICD-10-CM

## 2020-11-29 DIAGNOSIS — R45851 Suicidal ideations: Secondary | ICD-10-CM

## 2020-11-29 DIAGNOSIS — F429 Obsessive-compulsive disorder, unspecified: Secondary | ICD-10-CM

## 2020-11-30 ENCOUNTER — Other Ambulatory Visit: Payer: Self-pay | Admitting: Behavioral Health

## 2020-11-30 DIAGNOSIS — F4001 Agoraphobia with panic disorder: Secondary | ICD-10-CM

## 2020-11-30 DIAGNOSIS — F411 Generalized anxiety disorder: Secondary | ICD-10-CM

## 2020-11-30 DIAGNOSIS — F429 Obsessive-compulsive disorder, unspecified: Secondary | ICD-10-CM

## 2020-11-30 NOTE — Telephone Encounter (Signed)
90 day ok?

## 2020-12-01 NOTE — Telephone Encounter (Signed)
Appt on 11/4

## 2020-12-04 ENCOUNTER — Other Ambulatory Visit: Payer: Self-pay

## 2020-12-04 ENCOUNTER — Ambulatory Visit (INDEPENDENT_AMBULATORY_CARE_PROVIDER_SITE_OTHER): Payer: 59 | Admitting: Behavioral Health

## 2020-12-04 ENCOUNTER — Ambulatory Visit (INDEPENDENT_AMBULATORY_CARE_PROVIDER_SITE_OTHER): Payer: 59 | Admitting: Psychiatry

## 2020-12-04 ENCOUNTER — Encounter: Payer: Self-pay | Admitting: Behavioral Health

## 2020-12-04 VITALS — Wt 136.0 lb

## 2020-12-04 DIAGNOSIS — F4001 Agoraphobia with panic disorder: Secondary | ICD-10-CM | POA: Diagnosis not present

## 2020-12-04 DIAGNOSIS — F331 Major depressive disorder, recurrent, moderate: Secondary | ICD-10-CM | POA: Diagnosis not present

## 2020-12-04 DIAGNOSIS — Z91410 Personal history of adult physical and sexual abuse: Secondary | ICD-10-CM | POA: Diagnosis not present

## 2020-12-04 DIAGNOSIS — R45851 Suicidal ideations: Secondary | ICD-10-CM

## 2020-12-04 DIAGNOSIS — F411 Generalized anxiety disorder: Secondary | ICD-10-CM | POA: Diagnosis not present

## 2020-12-04 DIAGNOSIS — F3281 Premenstrual dysphoric disorder: Secondary | ICD-10-CM | POA: Diagnosis not present

## 2020-12-04 DIAGNOSIS — F429 Obsessive-compulsive disorder, unspecified: Secondary | ICD-10-CM

## 2020-12-04 DIAGNOSIS — Z79899 Other long term (current) drug therapy: Secondary | ICD-10-CM

## 2020-12-04 MED ORDER — VENLAFAXINE HCL 100 MG PO TABS: 150.0000 mg | ORAL_TABLET | Freq: Every day | ORAL | 3 refills | Status: DC

## 2020-12-04 MED ORDER — LITHIUM CARBONATE 300 MG PO TABS: 300.0000 mg | ORAL_TABLET | Freq: Two times a day (BID) | ORAL | 1 refills | Status: DC
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
## 2020-12-10 LAB — LITHIUM LEVEL: Lithium Lvl: 0.5 mmol/L (ref 0.5–1.2)

## 2020-12-18 ENCOUNTER — Other Ambulatory Visit: Payer: Self-pay

## 2020-12-18 ENCOUNTER — Ambulatory Visit (INDEPENDENT_AMBULATORY_CARE_PROVIDER_SITE_OTHER): Payer: 59 | Admitting: Psychiatry

## 2020-12-18 DIAGNOSIS — F4001 Agoraphobia with panic disorder: Secondary | ICD-10-CM

## 2020-12-18 DIAGNOSIS — F331 Major depressive disorder, recurrent, moderate: Secondary | ICD-10-CM

## 2020-12-18 DIAGNOSIS — F429 Obsessive-compulsive disorder, unspecified: Secondary | ICD-10-CM

## 2020-12-18 NOTE — Progress Notes (Signed)
Psychotherapy Progress Note [facility] Michelle Czar, PhD LP  Patient ID: Michelle Merritt)    MRN: [id number] Therapy format: Individual psychotherapy Date: 12/18/2020      Start: 2:15p     Stop: 3:05p     Time Spent: 50 min Location: In-person   Session narrative (presenting needs, interim history, self-report of stressors and symptoms, applications of prior therapy, status changes, and interventions made in session) Did officially disaffiliate from [employer], made it through a nervous required exit meeting anticipated to be conflictual, but her counterpart was plenty cool, just 45 min late, and only tried to offer a discounted membership.  Was able to articulate her reasons for leaving and let it stand without drama.  Looming feeling still of parents' disapproval, particularly her mother, for alleged need to be in a sorority vicariously.   Affirmed & encouraged still OK to exercise her own discretion, and if pressed, ask mother whose membership it is to decide.  Meanwhile, had a positive outing with mother and some of her friends, a Secretary/administrator, where no sorority was mentioned, no pressure exerted, just intriguing for how intoxicated one of the friends became and how welcoming others were to the one younger member of the crew.  Cites an "OCD" struggle with feeling like she has to work ahead with schoolwork, getting on things super-promptly, but more so feeling compelled to work almost incessantly.  Discerned more or less serving the fear of slacking, or not focusing, or somehow failing to get on the dean's list.  Oriented to the DBT tactic of opposite action.  For example, this morning was stuck long in a paper-writing session, and an exercise could be, e.g., decisively pausing a writing assignment mid-sentence and doing something else for an arbitrary amount of time.  The idea drew nervous laugh but interested.  Notes good developments re.  people-pleasing in stopping herself from saying compulsive "sorry" and not jumping up to serve [place] or friends at social gatherings.  Noticed by [name], in fact, and validated.  Paradoxically, feeling less "worthy", apparently for losing her usual reassurances.  Assured the feeling will pass as she gets more practice using her discretion, that it is basically normal progress "deprogramming" compulsive self-rules.  Therapeutic modalities: Cognitive Behavioral Therapy, Solution-Oriented/Positive Psychology, and Ego-Supportive  Mental Status/Observations:  Appearance:   Casual and Neat     Behavior:  Appropriate  Motor:  Normal  Speech/Language:   Clear and Coherent  Affect:  Appropriate  Mood:  anxious and less  Thought process:  normal  Thought content:    WNL  Sensory/Perceptual disturbances:    WNL  Orientation:  Fully oriented  Attention:  Good    Concentration:  Good  Memory:  WNL  Insight:    Good  Judgment:   Good  Impulse Control:  Good   Risk Assessment: Danger to Self: No Self-injurious Behavior: No Danger to Others: No Physical Aggression / Violence: No Duty to Warn: No Access to Firearms a concern: No  Assessment of progress:  progressing well  Diagnosis:   ICD-10-CM   1. Panic disorder with agoraphobia  F40.01     2. Obsessive-compulsive disorder, unspecified type  F42.9     3. Major depressive disorder, recurrent episode, moderate (HCC)  F33.1      Plan:  Opposite action experiments re. schoolwork compulsions Continue with discretion serving others -- may choose it, practice not doing it as compulsion, and trust the process to normalize vs. intrusive  thoughts of being unworthy or callous Other recommendations/advice as may be noted above Continue to utilize previously learned skills ad lib Maintain medication as prescribed and work faithfully with relevant prescriber(s) if any changes are desired or seem indicated Call the clinic on-call service, 988/hotline,  911, or present to [facility] or ER if any life-threatening psychiatric crisis Return for session(s) already scheduled. Already scheduled visit in this office 01/01/2021.  [name], PhD Michelle Czar, PhD LP Clinical Psychologist, [facility] [address] [place], [name] [address] [phone]

## 2020-12-19 ENCOUNTER — Other Ambulatory Visit: Payer: Self-pay | Admitting: Behavioral Health

## 2020-12-19 DIAGNOSIS — F411 Generalized anxiety disorder: Secondary | ICD-10-CM

## 2020-12-19 DIAGNOSIS — F4001 Agoraphobia with panic disorder: Secondary | ICD-10-CM

## 2020-12-23 ENCOUNTER — Other Ambulatory Visit: Payer: Self-pay | Admitting: Behavioral Health

## 2020-12-23 DIAGNOSIS — F411 Generalized anxiety disorder: Secondary | ICD-10-CM

## 2020-12-23 DIAGNOSIS — F4001 Agoraphobia with panic disorder: Secondary | ICD-10-CM

## 2020-12-23 DIAGNOSIS — F429 Obsessive-compulsive disorder, unspecified: Secondary | ICD-10-CM

## 2020-12-28 ENCOUNTER — Other Ambulatory Visit: Payer: Self-pay | Admitting: Behavioral Health

## 2020-12-28 DIAGNOSIS — R45851 Suicidal ideations: Secondary | ICD-10-CM

## 2020-12-28 DIAGNOSIS — F429 Obsessive-compulsive disorder, unspecified: Secondary | ICD-10-CM

## 2020-12-28 DIAGNOSIS — F331 Major depressive disorder, recurrent, moderate: Secondary | ICD-10-CM

## 2020-12-28 DIAGNOSIS — F411 Generalized anxiety disorder: Secondary | ICD-10-CM

## 2021-01-01 ENCOUNTER — Other Ambulatory Visit: Payer: Self-pay

## 2021-01-01 ENCOUNTER — Encounter: Payer: Self-pay | Admitting: Behavioral Health

## 2021-01-01 ENCOUNTER — Ambulatory Visit (INDEPENDENT_AMBULATORY_CARE_PROVIDER_SITE_OTHER): Payer: 59 | Admitting: Behavioral Health

## 2021-01-01 VITALS — Wt 134.0 lb

## 2021-01-01 DIAGNOSIS — F429 Obsessive-compulsive disorder, unspecified: Secondary | ICD-10-CM

## 2021-01-01 DIAGNOSIS — F4001 Agoraphobia with panic disorder: Secondary | ICD-10-CM | POA: Diagnosis not present

## 2021-01-01 DIAGNOSIS — F411 Generalized anxiety disorder: Secondary | ICD-10-CM

## 2021-01-01 DIAGNOSIS — Z79899 Other long term (current) drug therapy: Secondary | ICD-10-CM

## 2021-01-01 DIAGNOSIS — F331 Major depressive disorder, recurrent, moderate: Secondary | ICD-10-CM | POA: Diagnosis not present

## 2021-01-01 DIAGNOSIS — R45851 Suicidal ideations: Secondary | ICD-10-CM

## 2021-01-01 MED ORDER — VENLAFAXINE HCL 100 MG PO TABS
150.0000 mg | ORAL_TABLET | Freq: Every day | ORAL | 3 refills | Status: DC
Start: 2021-01-01 — End: 2021-02-03

## 2021-01-01 MED ORDER — LITHIUM CARBONATE 300 MG PO TABS: 300.0000 mg | ORAL_TABLET | Freq: Two times a day (BID) | ORAL | 3 refills | Status: DC

## 2021-01-01 NOTE — Progress Notes (Signed)
[facility]  Patient ID: Michelle Merritt,  MRN: [id number]  PCP: Derinda Late, MD  Date of Evaluation: 01/01/2021 Time spent:30 minutes  Chief Complaint:  Chief Complaint   Anxiety; Depression; Follow-up; Medication Refill; Suicidal Ideation     HISTORY/CURRENT STATUS: HPI 20 year old female presents to this office for follow up and medication management. This visit pt is calm and appears to be less anguished. She says that she has been free of suicidal ideation and has only experienced infrequent anxiety. She is very happy at how lithium has helped her. She would like to continue with this medication regimen and also continue with monthly follow ups until she has longer period of stability. She feels safe at this time and has verbally contracted for safety with this Probation officer. Says she does have some break through anxiety at times and depression but not severe. She has increased her her visits with Blanchie Serve for therapy to once a week. Says her anxiety today is 2/10 and depression 2/10.  She is sleeping 7 plus hours at night. S/I is present and frequent but without plan. No HI. No mania, no psychosis. Agrees to f/u labs with [facility] to obtain Lithium levels.    Past psychiatric medication trials Sertraline Fluoxetine Citalopram  Xanax   Individual Medical History/ Review of Systems: Changes? :No   Allergies: Amoxicillin, Penicillins, Dextromethorphan, and Phenylephrine  Current Medications:  Current Outpatient Medications:    ALPRAZolam (XANAX) 0.5 MG tablet, alprazolam 0.5 mg tablet  TAKE 1/2 TO 1 TABLET BY MOUTH EVERY 6 HOURS AS NEEDED ANXIETY, Disp: , Rfl:    ALPRAZolam (XANAX) 0.5 MG tablet, alprazolam 0.5 mg tablet   1 mg 4 times a day by oral route., Disp: , Rfl:    busPIRone (BUSPAR) 15 MG tablet, TAKE ONE TABLET BY MOUTH IN THE AM AND ONE AFTER DINNER., Disp: 180 tablet, Rfl: 0   HYDROcodone-acetaminophen (HYCET) 7.5-325 mg/15 ml solution, Take  10-15 mLs by mouth every 6 (six) hours as needed for moderate pain., Disp: 300 mL, Rfl: 0   HYDROcodone-acetaminophen (HYCET) 7.5-325 mg/15 ml solution, Take 10-15 mLs by mouth every 6 (six) hours as needed for moderate pain., Disp: 240 mL, Rfl: 0   HYDROcodone-acetaminophen (HYCET) 7.5-325 mg/15 ml solution, Take 10-15 mLs by mouth every 6 (six) hours as needed for moderate pain., Disp: 240 mL, Rfl: 0   Levonorgestrel (KYLEENA) 19.5 MG IUD, Kyleena 17.5 mcg/24 hrs (29yrs) 19.5mg  intrauterine device  Take 1 device by intrauterine route., Disp: , Rfl:    lithium 300 MG tablet, Take 1 tablet (300 mg total) by mouth 2 (two) times daily., Disp: 60 tablet, Rfl: 3   NORGESTIMATE-ETH ESTRADIOL PO, Take by mouth., Disp: , Rfl:    PROAIR RESPICLICK 123XX123 (90 Base) MCG/ACT AEPB, Inhale 2 puffs into the lungs every 4 (four) hours as needed., Disp: , Rfl:    propranolol (INDERAL) 20 MG tablet, propranolol 20 mg tablet  TAKE 1 TO 2 TABLETS BY MOUTH EVERY 6 HOURS AS NEEDED FOR ANXIETY, Disp: , Rfl:    propranolol (INDERAL) 20 MG tablet, Take 1 tablet (20 mg total) by mouth 3 (three) times daily., Disp: 90 tablet, Rfl: 2   SUMAtriptan (IMITREX) 100 MG tablet, sumatriptan 100 mg tablet  TAKE 1/2 1 TABLET AS NEEDED FOR MIGRAINE. MAY REPEAT IN 2 HRS AS NEEDED DO NOT EXCEED 2 IN 24 HOURSD, Disp: , Rfl:    venlafaxine (EFFEXOR) 100 MG tablet, Take 1.5 tablets (150 mg total) by mouth  daily after breakfast., Disp: 45 tablet, Rfl: 3   venlafaxine XR (EFFEXOR XR) 75 MG 24 hr capsule, Take 1 capsule (75 mg total) by mouth daily with breakfast., Disp: 30 capsule, Rfl: 2   venlafaxine XR (EFFEXOR-XR) 75 MG 24 hr capsule, venlafaxine ER 75 mg capsule,extended release 24 hr   75 mg every day by oral route., Disp: , Rfl:  Medication Side Effects: none  Family Medical/ Social History: Changes? No  MENTAL HEALTH EXAM:  There were no vitals taken for this visit.There is no height or weight on file to calculate BMI.  General  Appearance: Casual  Eye Contact:  Good  Speech:  Clear and Coherent  Volume:  Normal  Mood:  NA  Affect:  Appropriate  Thought Process:  Coherent  Orientation:  Full (Time, Place, and Person)  Thought Content: Logical   Suicidal Thoughts:  No  Homicidal Thoughts:  No  Memory:  WNL  Judgement:  Good  Insight:  Good  Psychomotor Activity:  Normal  Concentration:  Concentration: Good  Recall:  Good  Fund of Knowledge: Good  Language: Good  Assets:  Desire for Improvement  ADL's:  Intact  Cognition: WNL  Prognosis:  Good    DIAGNOSES:    ICD-10-CM   1. High risk medication use  Z79.899 Lithium level    2. Suicidal ideation  R45.851 lithium 300 MG tablet    3. Obsessive-compulsive disorder, unspecified type  F42.9 lithium 300 MG tablet    venlafaxine (EFFEXOR) 100 MG tablet    4. Major depressive disorder, recurrent episode, moderate (HCC)  F33.1 lithium 300 MG tablet    5. Generalized anxiety disorder  F41.1 lithium 300 MG tablet    venlafaxine (EFFEXOR) 100 MG tablet    6. Panic disorder with agoraphobia  F40.01 venlafaxine (EFFEXOR) 100 MG tablet      Receiving Psychotherapy: No    RECOMMENDATIONS:  Continue Lithium 300 mg twice daily (600 mg total) Continue Venlafaxine 150 mg daily Continue BuSpar 15 mg three times daily for anxiety.  Continue Inderal 20 mg twice daily as needed. Will report worsening symptoms promptly. Follow up in 4 weeks to reassess. Pt instructed to call if needed sooner.  Reinforced after hours emergency number and suicide hotline.  Greater than 50% of 20 min face to face time with patient was spent on counseling and coordination of care. We discussed continued symptoms of anxiety, ocd, PMDD, and stressors causing exacerbation of symptoms.  Discussed future medication options. She will go to [facility] for Lithium levels and will schedule again for next visit. Order submitted. She currently is not having any more SI.  She verbally contracted for  safety and agreed to turn over xanax to her boyfriend and also surrendered her sharps and knives. She says he has removed all items from room.  Reviewed PDMP           Joan Flores, NP

## 2021-01-08 ENCOUNTER — Other Ambulatory Visit: Payer: Self-pay

## 2021-01-08 ENCOUNTER — Ambulatory Visit (INDEPENDENT_AMBULATORY_CARE_PROVIDER_SITE_OTHER): Payer: 59 | Admitting: Psychiatry

## 2021-01-08 DIAGNOSIS — Z91410 Personal history of adult physical and sexual abuse: Secondary | ICD-10-CM | POA: Diagnosis not present

## 2021-01-08 DIAGNOSIS — F411 Generalized anxiety disorder: Secondary | ICD-10-CM | POA: Diagnosis not present

## 2021-01-08 DIAGNOSIS — F331 Major depressive disorder, recurrent, moderate: Secondary | ICD-10-CM
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
## 2021-01-15 LAB — LITHIUM LEVEL: Lithium Lvl: 0.7 mmol/L (ref 0.5–1.2)

## 2021-01-16 ENCOUNTER — Other Ambulatory Visit: Payer: Self-pay | Admitting: Behavioral Health

## 2021-01-16 DIAGNOSIS — F4001 Agoraphobia with panic disorder: Secondary | ICD-10-CM

## 2021-01-16 DIAGNOSIS — F411 Generalized anxiety disorder: Secondary | ICD-10-CM

## 2021-01-19 ENCOUNTER — Ambulatory Visit: Payer: 59 | Admitting: Psychiatry

## 2021-01-20 ENCOUNTER — Other Ambulatory Visit: Payer: Self-pay | Admitting: Behavioral Health

## 2021-01-20 DIAGNOSIS — F411 Generalized anxiety disorder: Secondary | ICD-10-CM

## 2021-01-20 DIAGNOSIS — F429 Obsessive-compulsive disorder, unspecified: Secondary | ICD-10-CM

## 2021-01-20 DIAGNOSIS — F4001 Agoraphobia with panic disorder: Secondary | ICD-10-CM

## 2021-02-03 ENCOUNTER — Ambulatory Visit (INDEPENDENT_AMBULATORY_CARE_PROVIDER_SITE_OTHER): Payer: 59 | Admitting: Behavioral Health

## 2021-02-03 ENCOUNTER — Encounter: Payer: Self-pay | Admitting: Behavioral Health

## 2021-02-03 ENCOUNTER — Other Ambulatory Visit: Payer: Self-pay

## 2021-02-03 DIAGNOSIS — R45851 Suicidal ideations: Secondary | ICD-10-CM | POA: Diagnosis not present

## 2021-02-03 DIAGNOSIS — F331 Major depressive disorder, recurrent, moderate: Secondary | ICD-10-CM

## 2021-02-03 DIAGNOSIS — F4001 Agoraphobia with panic disorder: Secondary | ICD-10-CM

## 2021-02-03 DIAGNOSIS — F411 Generalized anxiety disorder: Secondary | ICD-10-CM

## 2021-02-03 DIAGNOSIS — F429 Obsessive-compulsive disorder, unspecified: Secondary | ICD-10-CM

## 2021-02-03 MED ORDER — LITHIUM CARBONATE 300 MG PO TABS: 300.0000 mg | ORAL_TABLET | Freq: Two times a day (BID) | ORAL | 3 refills | Status: DC

## 2021-02-03 MED ORDER — BUSPIRONE HCL 15 MG PO TABS: ORAL_TABLET | ORAL | 2 refills | Status: DC

## 2021-02-03 MED ORDER — VENLAFAXINE HCL 100 MG PO TABS: 150.0000 mg | ORAL_TABLET | Freq: Every day | ORAL | 3 refills | Status: DC

## 2021-02-03 NOTE — Progress Notes (Signed)
[facility]  Patient ID: Tuwanda Meaker,  MRN: [id number]  PCP: Derinda Late, MD  Date of Evaluation: 02/03/2021 Time spent:30 minutes  Chief Complaint:   HISTORY/CURRENT STATUS: HPI      21 year old female presents to this office for follow up and medication management. This visit pt is calm and appears to be less anguished. She says that she has been free of suicidal ideation and has only experienced infrequent anxiety. She is very happy at how lithium has helped her. She would like to continue with this medication regimen and also continue with monthly follow ups until she has longer period of stability. She did have a period over the holiday break where she started to drink some margarita's and ended up drinking a whole bottle. She questions why she sometimes does not have control to limit her ETOH.  She  does feel safe at this time and has verbally contracted for safety with this Probation officer. Says she does have some break through anxiety at times and depression but not severe. She has increased her her visits with Blanchie Serve for therapy to once a week. Says her anxiety today is 2/10 and depression 2/10.  She is sleeping 7 plus hours at night. S/I is present and frequent but without plan. No HI. No mania, no psychosis. Agrees to f/u labs with [facility] to obtain Lithium levels.    Past psychiatric medication trials Sertraline Fluoxetine Citalopram  Xanax   Individual Medical History/ Review of Systems: Changes? :No   Allergies: Amoxicillin, Penicillins, Dextromethorphan, and Phenylephrine  Current Medications:  Current Outpatient Medications:    ALPRAZolam (XANAX) 0.5 MG tablet, alprazolam 0.5 mg tablet  TAKE 1/2 TO 1 TABLET BY MOUTH EVERY 6 HOURS AS NEEDED ANXIETY, Disp: , Rfl:    ALPRAZolam (XANAX) 0.5 MG tablet, alprazolam 0.5 mg tablet   1 mg 4 times a day by oral route., Disp: , Rfl:    busPIRone (BUSPAR) 15 MG tablet, Take one tablet three times  daily 8 hours between tablets., Disp: 90 tablet, Rfl: 2   Levonorgestrel (KYLEENA) 19.5 MG IUD, Kyleena 17.5 mcg/24 hrs (70yrs) 19.5mg  intrauterine device  Take 1 device by intrauterine route., Disp: , Rfl:    lithium 300 MG tablet, Take 1 tablet (300 mg total) by mouth 2 (two) times daily., Disp: 60 tablet, Rfl: 3   NORGESTIMATE-ETH ESTRADIOL PO, Take by mouth., Disp: , Rfl:    PROAIR RESPICLICK 123XX123 (90 Base) MCG/ACT AEPB, Inhale 2 puffs into the lungs every 4 (four) hours as needed., Disp: , Rfl:    propranolol (INDERAL) 20 MG tablet, propranolol 20 mg tablet  TAKE 1 TO 2 TABLETS BY MOUTH EVERY 6 HOURS AS NEEDED FOR ANXIETY, Disp: , Rfl:    propranolol (INDERAL) 20 MG tablet, Take 1 tablet (20 mg total) by mouth 3 (three) times daily., Disp: 90 tablet, Rfl: 2   SUMAtriptan (IMITREX) 100 MG tablet, sumatriptan 100 mg tablet  TAKE 1/2 1 TABLET AS NEEDED FOR MIGRAINE. MAY REPEAT IN 2 HRS AS NEEDED DO NOT EXCEED 2 IN 24 HOURSD, Disp: , Rfl:    venlafaxine (EFFEXOR) 100 MG tablet, Take 1.5 tablets (150 mg total) by mouth daily after breakfast., Disp: 45 tablet, Rfl: 3   venlafaxine XR (EFFEXOR XR) 75 MG 24 hr capsule, Take 1 capsule (75 mg total) by mouth daily with breakfast., Disp: 30 capsule, Rfl: 2   venlafaxine XR (EFFEXOR-XR) 75 MG 24 hr capsule, venlafaxine ER 75 mg capsule,extended release 24 hr  75 mg every day by oral route., Disp: , Rfl:  Medication Side Effects: none  Family Medical/ Social History: Changes? No  MENTAL HEALTH EXAM:  Weight 140 lb (63.5 kg).Body mass index is 23.3 kg/m.  General Appearance: Casual and Neat  Eye Contact:  Good  Speech:  Clear and Coherent  Volume:  Normal  Mood:  NA  Affect:  Appropriate  Thought Process:  Coherent  Orientation:  Full (Time, Place, and Person)  Thought Content: Logical   Suicidal Thoughts:  No  Homicidal Thoughts:  No  Memory:  WNL  Judgement:  Fair  Insight:  Good  Psychomotor Activity:  Normal  Concentration:   Concentration: Good  Recall:  Good  Fund of Knowledge: Good  Language: Good  Assets:  Desire for Improvement  ADL's:  Intact  Cognition: WNL  Prognosis:  Good    DIAGNOSES:    ICD-10-CM   1. Suicidal ideation  R45.851 lithium 300 MG tablet    2. Obsessive-compulsive disorder, unspecified type  F42.9 lithium 300 MG tablet    venlafaxine (EFFEXOR) 100 MG tablet    3. Major depressive disorder, recurrent episode, moderate (HCC)  F33.1 lithium 300 MG tablet    4. Generalized anxiety disorder  F41.1 lithium 300 MG tablet    busPIRone (BUSPAR) 15 MG tablet    venlafaxine (EFFEXOR) 100 MG tablet    5. Panic disorder with agoraphobia  F40.01 busPIRone (BUSPAR) 15 MG tablet    venlafaxine (EFFEXOR) 100 MG tablet      Receiving Psychotherapy: Yes    RECOMMENDATIONS:   Continue Lithium 300 mg twice daily (600 mg total) Continue Venlafaxine 150 mg daily Continue BuSpar 15 mg three times daily for anxiety.  Continue Inderal 20 mg twice daily as needed. Will report worsening symptoms promptly. Follow up in 4 weeks to reassess. Pt instructed to call if needed sooner.  Reinforced after hours emergency number and suicide hotline.  Greater than 50% of 20 min face to face time with patient was spent on counseling and coordination of care. We discussed continued symptoms of anxiety, ocd, PMDD, and stressors causing exacerbation of symptoms.  Discussed future medication options. She will go to [place] for Lithium levels and will schedule again for next visit.  She currently is not having any more SI.  She verbally contracted for safety and agreed to turn over xanax to her boyfriend and also surrendered her sharps and knives. She says he has removed all items from room.  Reviewed PDMP        Elwanda Brooklyn, NP

## 2021-02-08 ENCOUNTER — Ambulatory Visit (INDEPENDENT_AMBULATORY_CARE_PROVIDER_SITE_OTHER): Payer: 59 | Admitting: Psychiatry

## 2021-02-08 ENCOUNTER — Other Ambulatory Visit: Payer: Self-pay

## 2021-02-08 DIAGNOSIS — F3341 Major depressive disorder, recurrent, in partial remission: Secondary | ICD-10-CM

## 2021-02-08 DIAGNOSIS — F1211 Cannabis abuse, in remission: Secondary | ICD-10-CM

## 2021-02-08 DIAGNOSIS — F429 Obsessive-compulsive disorder, unspecified: Secondary | ICD-10-CM

## 2021-02-08 DIAGNOSIS — Z91410 Personal history of adult physical and sexual abuse: Secondary | ICD-10-CM

## 2021-02-08 DIAGNOSIS — F411 Generalized anxiety disorder: Secondary | ICD-10-CM

## 2021-02-08 IMAGING — CR DG RIBS 2V*R*
2 series · 2 of 2 positions shown · non-contrast
Comparison: No priors.

CLINICAL DATA: 19-year-old female with history of cough and
right-sided chest pain.

EXAM:
CHEST - 2 VIEW; RIGHT RIBS - 2 VIEW

[w ribs ap lower right (1 of 2)]
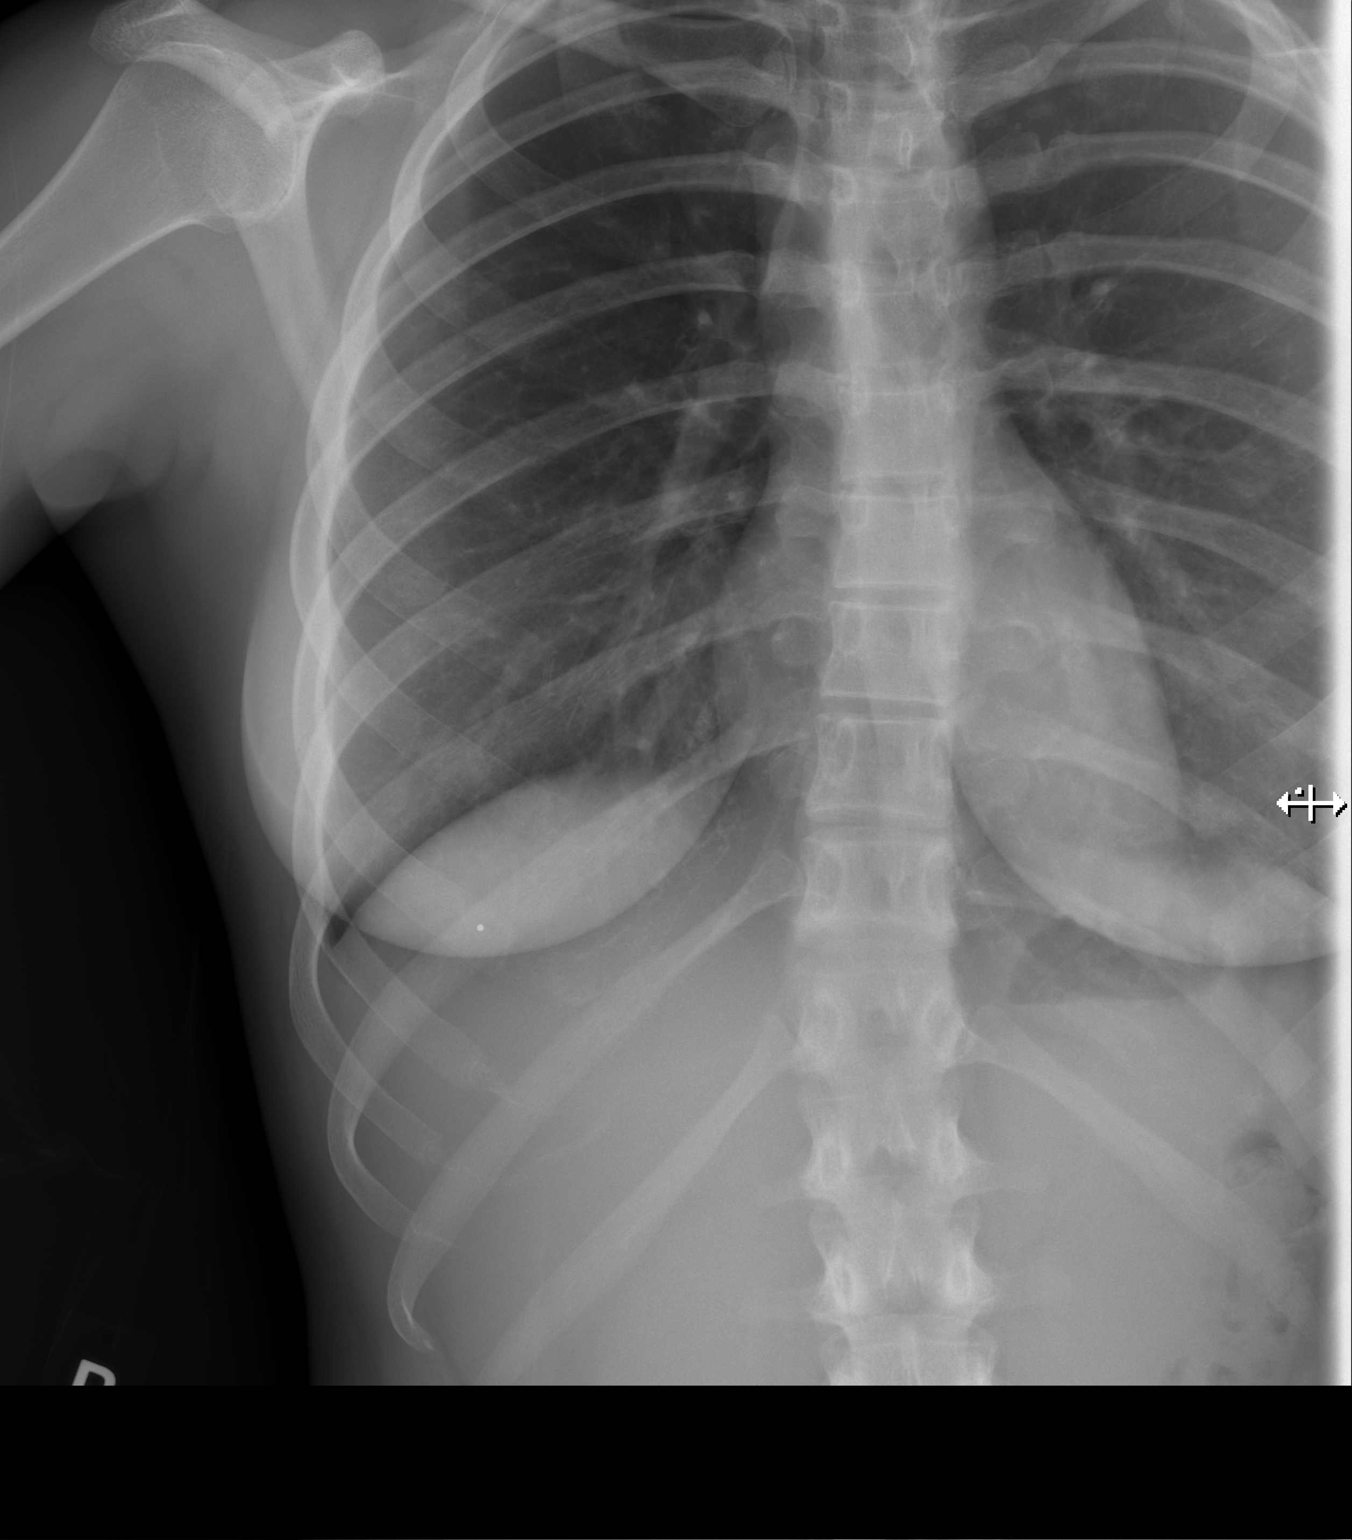

[w ribs ap lower right (2 of 2)]
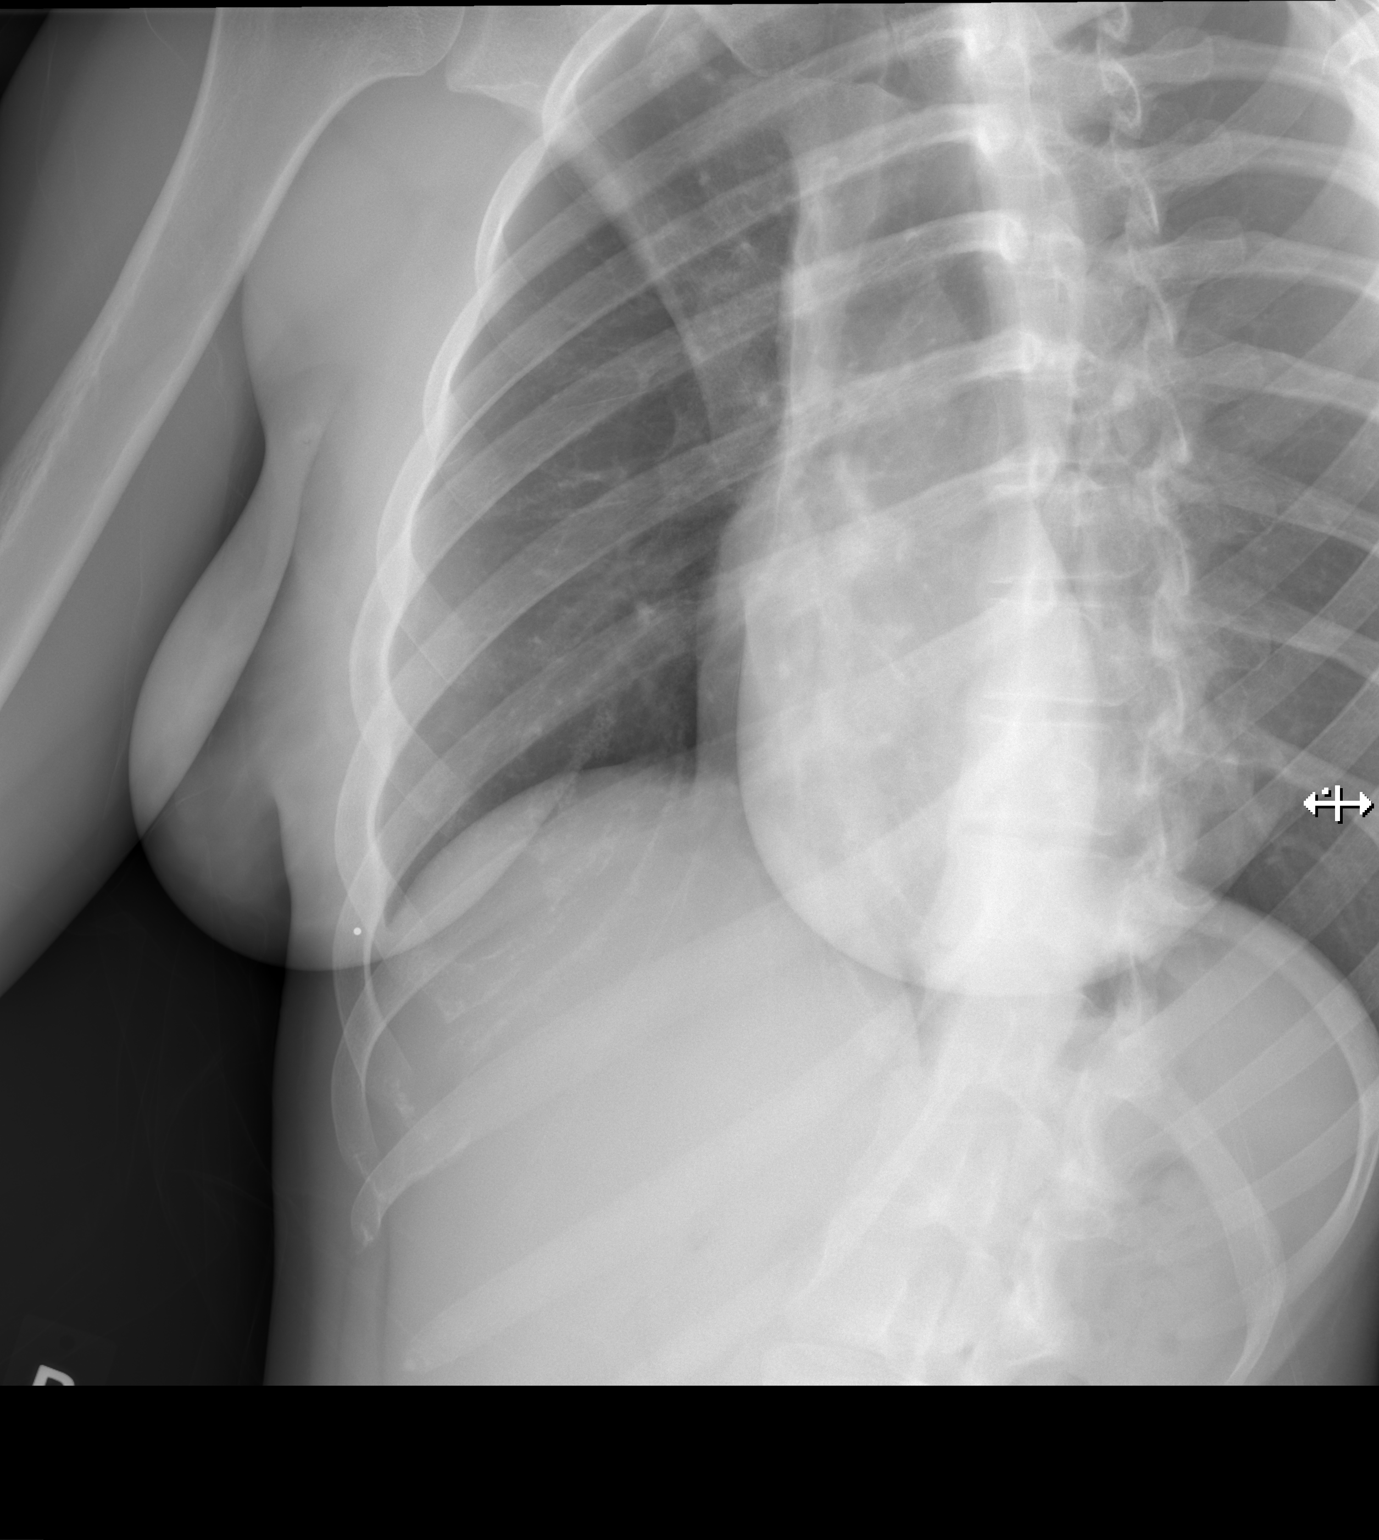

[2 of 2 positions shown; findings below may reference images not displayed]

FINDINGS: Lung volumes are normal. No consolidative airspace disease. No
pleural effusions. No pneumothorax. No pulmonary nodule or mass
noted. Pulmonary vasculature and the cardiomediastinal silhouette
are within normal limits.

Dedicated views of the right ribs demonstrate no acute displaced
right-sided rib fractures.
IMPRESSION: 1. No acute displaced right-sided rib fractures or radiographic
findings of acute cardiopulmonary disease.
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
## 2021-02-14 ENCOUNTER — Other Ambulatory Visit: Payer: Self-pay | Admitting: Behavioral Health

## 2021-02-14 DIAGNOSIS — F4001 Agoraphobia with panic disorder: Secondary | ICD-10-CM

## 2021-02-14 DIAGNOSIS — F411 Generalized anxiety disorder: Secondary | ICD-10-CM

## 2021-02-14 DIAGNOSIS — F429 Obsessive-compulsive disorder, unspecified: Secondary | ICD-10-CM

## 2021-02-22 ENCOUNTER — Other Ambulatory Visit: Payer: Self-pay

## 2021-02-22 ENCOUNTER — Ambulatory Visit (INDEPENDENT_AMBULATORY_CARE_PROVIDER_SITE_OTHER): Payer: 59 | Admitting: Psychiatry

## 2021-02-22 DIAGNOSIS — F411 Generalized anxiety disorder: Secondary | ICD-10-CM

## 2021-02-22 DIAGNOSIS — F429 Obsessive-compulsive disorder, unspecified: Secondary | ICD-10-CM | POA: Diagnosis not present

## 2021-02-22 DIAGNOSIS — F3341 Major depressive disorder, recurrent, in partial remission: Secondary | ICD-10-CM

## 2021-02-22 DIAGNOSIS — F1211 Cannabis abuse, in remission: Secondary | ICD-10-CM | POA: Diagnosis not present

## 2021-02-22 DIAGNOSIS — R69 Illness, unspecified: Secondary | ICD-10-CM

## 2021-02-22 DIAGNOSIS — F3281 Premenstrual dysphoric disorder: Secondary | ICD-10-CM

## 2021-02-22 DIAGNOSIS — Z91410 Personal history of adult physical and sexual abuse: Secondary | ICD-10-CM
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
## 2021-03-03 ENCOUNTER — Other Ambulatory Visit: Payer: Self-pay

## 2021-03-03 ENCOUNTER — Encounter: Payer: Self-pay | Admitting: Behavioral Health

## 2021-03-03 ENCOUNTER — Ambulatory Visit (INDEPENDENT_AMBULATORY_CARE_PROVIDER_SITE_OTHER): Payer: 59 | Admitting: Behavioral Health

## 2021-03-03 VITALS — Wt 146.0 lb

## 2021-03-03 DIAGNOSIS — F429 Obsessive-compulsive disorder, unspecified: Secondary | ICD-10-CM

## 2021-03-03 DIAGNOSIS — F3341 Major depressive disorder, recurrent, in partial remission: Secondary | ICD-10-CM | POA: Diagnosis not present

## 2021-03-03 DIAGNOSIS — F411 Generalized anxiety disorder: Secondary | ICD-10-CM

## 2021-03-03 DIAGNOSIS — R45851 Suicidal ideations: Secondary | ICD-10-CM

## 2021-03-03 DIAGNOSIS — F1211 Cannabis abuse, in remission: Secondary | ICD-10-CM

## 2021-03-03 DIAGNOSIS — Z79899 Other long term (current) drug therapy: Secondary | ICD-10-CM

## 2021-03-03 NOTE — Progress Notes (Signed)
[facility]  Patient ID: Dianette Glowacki,  MRN: [id number]  PCP: Derinda Late, MD  Date of Evaluation: 03/03/2021 Time spent:30 minutes  Chief Complaint:  Chief Complaint   Anxiety; Depression; Follow-up; Medication Refill     HISTORY/CURRENT STATUS: HPI 21 year old female presents to this office for follow up and medication management. This visit pt is calm and appears to be less anguished. She says that she has been free of suicidal ideation and has only experienced infrequent anxiety. She continues to be very happy at how lithium has helped her, and she expresses how it has been her most important medication. She would like to continue with this medication regimen and would like to continue with frequent follow ups. She  does feel safe at this time and has verbally contracted for safety with this Probation officer. Says she does have some break through anxiety at times and depression but not severe. She has  her visits with Blanchie Serve for therapy every two weeks. Says her anxiety today is 2/10 and depression 2/10.  She is sleeping 7 plus hours at night. S/I is present and frequent but without plan. No HI. No mania, no psychosis. Agrees to f/u labs with [facility] to obtain Lithium levels every three months. Last visit Lithium levels were in range.    Past psychiatric medication trials Sertraline Fluoxetine Citalopram  Xanax        Individual Medical History/ Review of Systems: Changes? :No   Allergies: Amoxicillin, Penicillins, Dextromethorphan, and Phenylephrine  Current Medications:  Current Outpatient Medications:    ALPRAZolam (XANAX) 0.5 MG tablet, alprazolam 0.5 mg tablet  TAKE 1/2 TO 1 TABLET BY MOUTH EVERY 6 HOURS AS NEEDED ANXIETY, Disp: , Rfl:    ALPRAZolam (XANAX) 0.5 MG tablet, alprazolam 0.5 mg tablet   1 mg 4 times a day by oral route., Disp: , Rfl:    busPIRone (BUSPAR) 15 MG tablet, Take one tablet three times daily 8 hours between tablets.,  Disp: 90 tablet, Rfl: 2   Levonorgestrel (KYLEENA) 19.5 MG IUD, Kyleena 17.5 mcg/24 hrs (48yrs) 19.5mg  intrauterine device  Take 1 device by intrauterine route., Disp: , Rfl:    lithium 300 MG tablet, Take 1 tablet (300 mg total) by mouth 2 (two) times daily., Disp: 60 tablet, Rfl: 3   NORGESTIMATE-ETH ESTRADIOL PO, Take by mouth., Disp: , Rfl:    PROAIR RESPICLICK 123XX123 (90 Base) MCG/ACT AEPB, Inhale 2 puffs into the lungs every 4 (four) hours as needed., Disp: , Rfl:    propranolol (INDERAL) 20 MG tablet, propranolol 20 mg tablet  TAKE 1 TO 2 TABLETS BY MOUTH EVERY 6 HOURS AS NEEDED FOR ANXIETY, Disp: , Rfl:    propranolol (INDERAL) 20 MG tablet, Take 1 tablet (20 mg total) by mouth 3 (three) times daily., Disp: 90 tablet, Rfl: 2   SUMAtriptan (IMITREX) 100 MG tablet, sumatriptan 100 mg tablet  TAKE 1/2 1 TABLET AS NEEDED FOR MIGRAINE. MAY REPEAT IN 2 HRS AS NEEDED DO NOT EXCEED 2 IN 24 HOURSD, Disp: , Rfl:    venlafaxine (EFFEXOR) 100 MG tablet, Take 1.5 tablets (150 mg total) by mouth daily after breakfast., Disp: 45 tablet, Rfl: 3   venlafaxine XR (EFFEXOR XR) 75 MG 24 hr capsule, Take 1 capsule (75 mg total) by mouth daily with breakfast., Disp: 30 capsule, Rfl: 2   venlafaxine XR (EFFEXOR-XR) 75 MG 24 hr capsule, venlafaxine ER 75 mg capsule,extended release 24 hr   75 mg every day by oral route.,  Disp: , Rfl:  Medication Side Effects: none  Family Medical/ Social History: Changes? No  MENTAL HEALTH EXAM:  There were no vitals taken for this visit.There is no height or weight on file to calculate BMI.  General Appearance: Casual  Eye Contact:  Good  Speech:  Clear and Coherent  Volume:  Normal  Mood:  NA  Affect:  Appropriate  Thought Process:  Coherent  Orientation:  Full (Time, Place, and Person)  Thought Content: Logical   Suicidal Thoughts:  No  Homicidal Thoughts:  No  Memory:  WNL  Judgement:  Fair  Insight:  Good  Psychomotor Activity:  Normal  Concentration:   Concentration: Good  Recall:  Good  Fund of Knowledge: Good  Language: Good  Assets:  Desire for Improvement  ADL's:  Intact  Cognition: WNL  Prognosis:  Good    DIAGNOSES:    ICD-10-CM   1. Generalized anxiety disorder with panic attacks and hx of dissociation -- r/o PTSD  F41.1     2. OCD, unspecified type  F42.9     3. Major depressive disorder, recurrent, in partial remission ([place])  F33.41     4. Suicidal ideation  R45.851     5. 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 High risk medication use  Z79.899       Receiving Psychotherapy: Yes    RECOMMENDATIONS:   Continue Lithium 300 mg twice daily (600 mg total) Continue Venlafaxine 150 mg daily Continue BuSpar 15 mg three times daily for anxiety.  Continue Inderal 20 mg twice daily as needed. Will report worsening symptoms promptly. Follow up in 6 weeks to reassess. Pt instructed to call if needed sooner.  Reinforced after hours emergency number and suicide hotline.  Greater than 50% of 30 min face to face time with patient was spent on counseling and coordination of care. We discussed continued symptoms of anxiety, ocd, PMDD, and stressors causing exacerbation of symptoms.  Discussed efficacy with her medications. She is progressing very well and not reporting unmanageable levels on anxiety and depression this visit. More concern lies with her OCD behaviors. I did reinforce my concern for safety over binge drinking episodes. 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 She is not having suicidal ideation since last visit. She is happy with medications right now and no indication for changes at this time. She agrees to continue in therapy every two weeks for now.  She verbally contracted for safety.Reviewed PDMP        Elwanda Brooklyn, NP

## 2021-03-04 ENCOUNTER — Other Ambulatory Visit: Payer: Self-pay | Admitting: Behavioral Health

## 2021-03-04 DIAGNOSIS — R45851 Suicidal ideations: Secondary | ICD-10-CM

## 2021-03-04 DIAGNOSIS — F331 Major depressive disorder, recurrent, moderate: Secondary | ICD-10-CM

## 2021-03-04 DIAGNOSIS — F411 Generalized anxiety disorder: Secondary | ICD-10-CM

## 2021-03-04 DIAGNOSIS — F429 Obsessive-compulsive disorder, unspecified: Secondary | ICD-10-CM

## 2021-03-09 ENCOUNTER — Other Ambulatory Visit: Payer: Self-pay | Admitting: Behavioral Health

## 2021-03-09 DIAGNOSIS — F4001 Agoraphobia with panic disorder: Secondary | ICD-10-CM

## 2021-03-09 DIAGNOSIS — F411 Generalized anxiety disorder: Secondary | ICD-10-CM

## 2021-03-24 ENCOUNTER — Ambulatory Visit (INDEPENDENT_AMBULATORY_CARE_PROVIDER_SITE_OTHER): Payer: 59 | Admitting: Psychiatry

## 2021-03-24 DIAGNOSIS — F429 Obsessive-compulsive disorder, unspecified: Secondary | ICD-10-CM | POA: Diagnosis not present

## 2021-03-24 DIAGNOSIS — R69 Illness, unspecified: Secondary | ICD-10-CM

## 2021-03-24 DIAGNOSIS — F411 Generalized anxiety disorder: Secondary | ICD-10-CM

## 2021-03-24 DIAGNOSIS — F331 Major depressive disorder, recurrent, moderate: Secondary | ICD-10-CM

## 2021-03-24 DIAGNOSIS — Z91410 Personal history of adult physical and sexual abuse: Secondary | ICD-10-CM | POA: Diagnosis not present

## 2021-03-24 DIAGNOSIS — F1211 Cannabis abuse, in remission: Secondary | ICD-10-CM
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
## 2021-04-05 ENCOUNTER — Ambulatory Visit: Payer: 59 | Admitting: Psychiatry

## 2021-04-14 ENCOUNTER — Other Ambulatory Visit: Payer: Self-pay

## 2021-04-14 ENCOUNTER — Encounter: Payer: Self-pay | Admitting: Behavioral Health

## 2021-04-14 ENCOUNTER — Ambulatory Visit (INDEPENDENT_AMBULATORY_CARE_PROVIDER_SITE_OTHER): Payer: 59 | Admitting: Behavioral Health

## 2021-04-14 VITALS — Wt 151.0 lb

## 2021-04-14 DIAGNOSIS — R69 Illness, unspecified: Secondary | ICD-10-CM

## 2021-04-14 DIAGNOSIS — Z91410 Personal history of adult physical and sexual abuse: Secondary | ICD-10-CM

## 2021-04-14 DIAGNOSIS — F3341 Major depressive disorder, recurrent, in partial remission: Secondary | ICD-10-CM | POA: Diagnosis not present

## 2021-04-14 DIAGNOSIS — F411 Generalized anxiety disorder: Secondary | ICD-10-CM

## 2021-04-14 DIAGNOSIS — F429 Obsessive-compulsive disorder, unspecified: Secondary | ICD-10-CM | POA: Diagnosis not present

## 2021-04-14 DIAGNOSIS — R45851 Suicidal ideations: Secondary | ICD-10-CM

## 2021-04-14 DIAGNOSIS — F4001 Agoraphobia with panic disorder: Secondary | ICD-10-CM

## 2021-04-14 DIAGNOSIS — Z79899 Other long term (current) drug therapy: Secondary | ICD-10-CM
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
## 2021-04-19 ENCOUNTER — Other Ambulatory Visit: Payer: Self-pay

## 2021-04-19 ENCOUNTER — Ambulatory Visit (INDEPENDENT_AMBULATORY_CARE_PROVIDER_SITE_OTHER): Payer: 59 | Admitting: Psychiatry

## 2021-04-19 ENCOUNTER — Other Ambulatory Visit: Payer: Self-pay | Admitting: Behavioral Health

## 2021-04-19 DIAGNOSIS — F429 Obsessive-compulsive disorder, unspecified: Secondary | ICD-10-CM

## 2021-04-19 DIAGNOSIS — R69 Illness, unspecified: Secondary | ICD-10-CM | POA: Diagnosis not present

## 2021-04-19 DIAGNOSIS — F4001 Agoraphobia with panic disorder: Secondary | ICD-10-CM

## 2021-04-19 DIAGNOSIS — Z91410 Personal history of adult physical and sexual abuse: Secondary | ICD-10-CM | POA: Diagnosis not present

## 2021-04-19 DIAGNOSIS — F331 Major depressive disorder, recurrent, moderate: Secondary | ICD-10-CM

## 2021-04-19 DIAGNOSIS — F411 Generalized anxiety disorder: Secondary | ICD-10-CM | POA: Diagnosis not present

## 2021-04-19 DIAGNOSIS — R45851 Suicidal ideations: Secondary | ICD-10-CM
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
## 2021-05-03 ENCOUNTER — Ambulatory Visit (INDEPENDENT_AMBULATORY_CARE_PROVIDER_SITE_OTHER): Payer: 59 | Admitting: Psychiatry

## 2021-05-03 DIAGNOSIS — Z91410 Personal history of adult physical and sexual abuse: Secondary | ICD-10-CM | POA: Diagnosis not present

## 2021-05-03 DIAGNOSIS — F429 Obsessive-compulsive disorder, unspecified: Secondary | ICD-10-CM

## 2021-05-03 DIAGNOSIS — F331 Major depressive disorder, recurrent, moderate: Secondary | ICD-10-CM

## 2021-05-03 DIAGNOSIS — R69 Illness, unspecified: Secondary | ICD-10-CM

## 2021-05-03 DIAGNOSIS — F411 Generalized anxiety disorder: Secondary | ICD-10-CM

## 2021-05-03 DIAGNOSIS — F4001 Agoraphobia with panic disorder: Secondary | ICD-10-CM
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
## 2021-05-08 ENCOUNTER — Other Ambulatory Visit: Payer: Self-pay | Admitting: Behavioral Health

## 2021-05-08 DIAGNOSIS — F4001 Agoraphobia with panic disorder: Secondary | ICD-10-CM

## 2021-05-08 DIAGNOSIS — F411 Generalized anxiety disorder: Secondary | ICD-10-CM

## 2021-05-08 DIAGNOSIS — R45851 Suicidal ideations: Secondary | ICD-10-CM

## 2021-05-08 DIAGNOSIS — F429 Obsessive-compulsive disorder, unspecified: Secondary | ICD-10-CM

## 2021-05-19 ENCOUNTER — Ambulatory Visit (INDEPENDENT_AMBULATORY_CARE_PROVIDER_SITE_OTHER): Payer: 59 | Admitting: Psychiatry

## 2021-05-19 DIAGNOSIS — Z91410 Personal history of adult physical and sexual abuse: Secondary | ICD-10-CM | POA: Diagnosis not present

## 2021-05-19 DIAGNOSIS — F429 Obsessive-compulsive disorder, unspecified: Secondary | ICD-10-CM

## 2021-05-19 DIAGNOSIS — F411 Generalized anxiety disorder: Secondary | ICD-10-CM

## 2021-05-19 DIAGNOSIS — F1211 Cannabis abuse, in remission: Secondary | ICD-10-CM

## 2021-05-19 DIAGNOSIS — F331 Major depressive disorder, recurrent, moderate: Secondary | ICD-10-CM

## 2021-05-19 DIAGNOSIS — F4001 Agoraphobia with panic disorder: Secondary | ICD-10-CM

## 2021-05-19 DIAGNOSIS — R69 Illness, unspecified: Secondary | ICD-10-CM
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
## 2021-05-25 LAB — LITHIUM LEVEL: Lithium Lvl: 0.4 mmol/L — ABNORMAL LOW (ref 0.6–1.2)

## 2021-05-25 LAB — TSH: TSH: 2.05 mIU/L

## 2021-05-27 ENCOUNTER — Telehealth: Payer: 59 | Admitting: Behavioral Health

## 2021-05-27 DIAGNOSIS — F489 Nonpsychotic mental disorder, unspecified: Secondary | ICD-10-CM

## 2021-05-27 NOTE — Progress Notes (Signed)
Was not able to reach [place] in Video visit but I was 10 min late getting into session. I tried twice to call cell phone but no answer. I left VM to reschedule. No charge today. ?

## 2021-06-03 ENCOUNTER — Ambulatory Visit (INDEPENDENT_AMBULATORY_CARE_PROVIDER_SITE_OTHER): Payer: 59 | Admitting: Psychiatry

## 2021-06-03 DIAGNOSIS — R69 Illness, unspecified: Secondary | ICD-10-CM

## 2021-06-03 DIAGNOSIS — F331 Major depressive disorder, recurrent, moderate: Secondary | ICD-10-CM | POA: Diagnosis not present

## 2021-06-03 DIAGNOSIS — F411 Generalized anxiety disorder: Secondary | ICD-10-CM

## 2021-06-03 DIAGNOSIS — Z91410 Personal history of adult physical and sexual abuse: Secondary | ICD-10-CM

## 2021-06-03 DIAGNOSIS — F1211 Cannabis abuse, in remission: Secondary | ICD-10-CM

## 2021-06-03 DIAGNOSIS — F429 Obsessive-compulsive disorder, unspecified: Secondary | ICD-10-CM

## 2021-06-03 DIAGNOSIS — F4001 Agoraphobia with panic disorder: Secondary | ICD-10-CM
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
## 2021-06-07 ENCOUNTER — Telehealth: Payer: Self-pay | Admitting: Behavioral Health

## 2021-06-07 DIAGNOSIS — F429 Obsessive-compulsive disorder, unspecified: Secondary | ICD-10-CM

## 2021-06-07 DIAGNOSIS — R45851 Suicidal ideations: Secondary | ICD-10-CM

## 2021-06-07 DIAGNOSIS — F331 Major depressive disorder, recurrent, moderate: Secondary | ICD-10-CM

## 2021-06-07 DIAGNOSIS — F411 Generalized anxiety disorder: Secondary | ICD-10-CM

## 2021-06-07 MED ORDER — LITHIUM CARBONATE 300 MG PO TABS: 300.0000 mg | ORAL_TABLET | Freq: Two times a day (BID) | ORAL | 0 refills | Status: DC

## 2021-06-07 NOTE — Telephone Encounter (Signed)
Michelle Merritt called to make appt with Michelle Merritt but also requested refill of her Lithium.  Has appt tomorrow but needed it called in today.  [facility] [address].  I told her for future refills to call the pharmacy. ?

## 2021-06-07 NOTE — Telephone Encounter (Signed)
Rx sent 

## 2021-06-08 ENCOUNTER — Ambulatory Visit (INDEPENDENT_AMBULATORY_CARE_PROVIDER_SITE_OTHER): Payer: 59 | Admitting: Behavioral Health

## 2021-06-08 ENCOUNTER — Encounter: Payer: Self-pay | Admitting: Behavioral Health

## 2021-06-08 DIAGNOSIS — F411 Generalized anxiety disorder: Secondary | ICD-10-CM | POA: Diagnosis not present

## 2021-06-08 DIAGNOSIS — R45851 Suicidal ideations: Secondary | ICD-10-CM

## 2021-06-08 DIAGNOSIS — F4001 Agoraphobia with panic disorder: Secondary | ICD-10-CM

## 2021-06-08 DIAGNOSIS — F331 Major depressive disorder, recurrent, moderate: Secondary | ICD-10-CM

## 2021-06-08 DIAGNOSIS — F429 Obsessive-compulsive disorder, unspecified: Secondary | ICD-10-CM

## 2021-06-08 MED ORDER — LITHIUM CARBONATE 300 MG PO TABS: 300.0000 mg | ORAL_TABLET | Freq: Two times a day (BID) | ORAL | 3 refills | Status: DC

## 2021-06-08 MED ORDER — VENLAFAXINE HCL 100 MG PO TABS: 150.0000 mg | ORAL_TABLET | Freq: Every day | ORAL | 3 refills | Status: DC

## 2021-06-08 NOTE — Progress Notes (Signed)
[facility] ? ?Patient ID: Michelle Merritt,  ?MRN: [id number] ? ?PCP: Mosetta Putt, MD ? ?Date of Evaluation: 06/08/2021 ?Time spent:30 minutes ? ?Chief Complaint:  ? ?HISTORY/CURRENT STATUS: ?HPI ? ?21 year old female presents to this office for follow up and medication management. This visit pt is calm and appears to be a little more happy and content. She expresses feeling fairly stable this visit. She stopped her buspar and can notice the return of some anxiety but manageable. SI continues to be suppressed with the Lithium. She reports anxiety increase after drinking ETOH. Says her boyfriend does get on her nerves. She doubts long term relationship with him. She does not want to make any changes to her medications this visit. She  does feel safe at this time and has verbally contracted for safety with this Clinical research associate.  Says her anxiety today is 2/10 and depression 2/10.  She is sleeping 7 plus hours at night. No current SI/ No HI. No mania, no psychosis. Agrees to f/u labs with [facility] to obtain Lithium levels every three months. No concerns with last Lithium levels. ?  ?Past psychiatric medication trials ?Sertraline ?Fluoxetine ?Citalopram  ?Xanax ?  ?  ? ? ? ? ? ?Individual Medical History/ Review of Systems: Changes? :No  ? ?Allergies: Amoxicillin, Penicillins, Dextromethorphan, and Phenylephrine ? ?Current Medications:  ?Current Outpatient Medications:  ?  busPIRone (BUSPAR) 15 MG tablet, TAKE 1 TABLET BY MOUTH 3 TIMES A DAY, Disp: 90 tablet, Rfl: 0 ?  Levonorgestrel (KYLEENA) 19.5 MG IUD, Kyleena 17.5 mcg/24 hrs (71yrs) 19.5mg  intrauterine device  Take 1 device by intrauterine route., Disp: , Rfl:  ?  lithium 300 MG tablet, Take 1 tablet (300 mg total) by mouth 2 (two) times daily., Disp: 60 tablet, Rfl: 3 ?  NORGESTIMATE-ETH ESTRADIOL PO, Take by mouth., Disp: , Rfl:  ?  PROAIR RESPICLICK 108 (90 Base) MCG/ACT AEPB, Inhale 2 puffs into the lungs every 4 (four) hours as needed., Disp: ,  Rfl:  ?  propranolol (INDERAL) 20 MG tablet, propranolol 20 mg tablet  TAKE 1 TO 2 TABLETS BY MOUTH EVERY 6 HOURS AS NEEDED FOR ANXIETY, Disp: , Rfl:  ?  propranolol (INDERAL) 20 MG tablet, Take 1 tablet (20 mg total) by mouth 3 (three) times daily., Disp: 90 tablet, Rfl: 2 ?  SUMAtriptan (IMITREX) 100 MG tablet, sumatriptan 100 mg tablet  TAKE 1/2 1 TABLET AS NEEDED FOR MIGRAINE. MAY REPEAT IN 2 HRS AS NEEDED DO NOT EXCEED 2 IN 24 HOURSD, Disp: , Rfl:  ?  venlafaxine (EFFEXOR) 100 MG tablet, Take 1.5 tablets (150 mg total) by mouth daily after breakfast., Disp: 45 tablet, Rfl: 3 ?Medication Side Effects: none ? ?Family Medical/ Social History: Changes? No ? ?MENTAL HEALTH EXAM: ? ?There were no vitals taken for this visit.There is no height or weight on file to calculate BMI.  ?General Appearance: Casual, Neat, and Well Groomed  ?Eye Contact:  Good  ?Speech:  Clear and Coherent  ?Volume:  Normal  ?Mood:  NA  ?Affect:  Appropriate  ?Thought Process:  Coherent  ?Orientation:  Full (Time, Place, and Person)  ?Thought Content: Logical   ?Suicidal Thoughts:  No  ?Homicidal Thoughts:  No  ?Memory:  WNL  ?Judgement:  Good  ?Insight:  Good  ?Psychomotor Activity:  Normal  ?Concentration:  Concentration: Good  ?Recall:  Good  ?Fund of Knowledge: Good  ?Language: Good  ?Assets:  Desire for Improvement  ?ADL's:  Intact  ?Cognition: WNL  ?Prognosis:  Good  ? ? ?DIAGNOSES:  ?  ICD-10-CM   ?1. Suicidal ideation  R45.851 lithium 300 MG tablet  ?  ?2. Obsessive-compulsive disorder, unspecified type  F42.9 lithium 300 MG tablet  ?  venlafaxine (EFFEXOR) 100 MG tablet  ?  ?3. Major depressive disorder, recurrent episode, moderate (HCC)  F33.1 lithium 300 MG tablet  ?  ?4. Generalized anxiety disorder  F41.1 lithium 300 MG tablet  ?  venlafaxine (EFFEXOR) 100 MG tablet  ?  ?5. Panic disorder with agoraphobia  F40.01 venlafaxine (EFFEXOR) 100 MG tablet  ?  ? ? ?Receiving Psychotherapy: No  ? ? ?RECOMMENDATIONS: ? ?Continue Lithium 300  mg twice daily (600 mg total) ?Continue Venlafaxine 150 mg daily ?Stopped Buspar ?Continue Inderal 20 mg twice daily as needed. ?Will report worsening symptoms promptly. ?Follow up in 2 months to reassess. ?Reinforced after hours emergency number and suicide hotline.  ?Greater than 50% of 30 min face to face time with patient was spent on counseling and coordination of care. We discussed continued symptoms of anxiety, ocd, PMDD, and stressors causing exacerbation of symptoms.  Discussed efficacy with her medications. She is progressing very well and not reporting unmanageable levels on anxiety and depression this visit. Still struggles with OCD aspect. She usually straightens things out in my office. She centered the tissue box on table this visit.  She is having some mild, fleeting suicidal ideation since last visit, but no plans and she reinforces that she would not act on anything. Lithium continues to stabilize this. She feels more secure with the Lithium on board and does not want to consider decrease at this time. She had questions about some of her medications and why she needs to continue. She would like to discuss switching from Effexor next visit to another AD if possible. She is not using any Xanax anymore.  ?She will continue in therapy. ?She verbally contracted for safety. ?Reviewed PDMP  ? ? ? ? ? ? ? ? ? ? ?Joan Flores, NP  ?

## 2021-06-14 ENCOUNTER — Ambulatory Visit (INDEPENDENT_AMBULATORY_CARE_PROVIDER_SITE_OTHER): Payer: 59 | Admitting: Psychiatry

## 2021-06-14 DIAGNOSIS — F411 Generalized anxiety disorder: Secondary | ICD-10-CM | POA: Diagnosis not present

## 2021-06-14 DIAGNOSIS — F331 Major depressive disorder, recurrent, moderate: Secondary | ICD-10-CM

## 2021-06-14 DIAGNOSIS — F4001 Agoraphobia with panic disorder: Secondary | ICD-10-CM

## 2021-06-14 DIAGNOSIS — F429 Obsessive-compulsive disorder, unspecified: Secondary | ICD-10-CM

## 2021-06-14 DIAGNOSIS — Z91410 Personal history of adult physical and sexual abuse: Secondary | ICD-10-CM

## 2021-06-14 DIAGNOSIS — R69 Illness, unspecified: Secondary | ICD-10-CM
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
## 2021-06-29 ENCOUNTER — Ambulatory Visit (INDEPENDENT_AMBULATORY_CARE_PROVIDER_SITE_OTHER): Payer: 59 | Admitting: Psychiatry

## 2021-06-29 DIAGNOSIS — F4310 Post-traumatic stress disorder, unspecified: Secondary | ICD-10-CM | POA: Diagnosis not present

## 2021-06-29 DIAGNOSIS — F1211 Cannabis abuse, in remission: Secondary | ICD-10-CM | POA: Diagnosis not present

## 2021-06-29 DIAGNOSIS — Z91410 Personal history of adult physical and sexual abuse: Secondary | ICD-10-CM | POA: Diagnosis not present

## 2021-06-29 DIAGNOSIS — R69 Illness, unspecified: Secondary | ICD-10-CM
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
## 2021-06-30 ENCOUNTER — Other Ambulatory Visit: Payer: Self-pay | Admitting: Behavioral Health

## 2021-06-30 DIAGNOSIS — F411 Generalized anxiety disorder: Secondary | ICD-10-CM

## 2021-06-30 DIAGNOSIS — F4001 Agoraphobia with panic disorder: Secondary | ICD-10-CM

## 2021-06-30 DIAGNOSIS — F429 Obsessive-compulsive disorder, unspecified: Secondary | ICD-10-CM

## 2021-07-20 ENCOUNTER — Ambulatory Visit (INDEPENDENT_AMBULATORY_CARE_PROVIDER_SITE_OTHER): Payer: 59 | Admitting: Psychiatry

## 2021-07-20 DIAGNOSIS — F331 Major depressive disorder, recurrent, moderate: Secondary | ICD-10-CM

## 2021-07-20 DIAGNOSIS — R69 Illness, unspecified: Secondary | ICD-10-CM

## 2021-07-20 DIAGNOSIS — F4310 Post-traumatic stress disorder, unspecified: Secondary | ICD-10-CM

## 2021-07-20 DIAGNOSIS — F429 Obsessive-compulsive disorder, unspecified: Secondary | ICD-10-CM

## 2021-07-20 DIAGNOSIS — F1211 Cannabis abuse, in remission: Secondary | ICD-10-CM
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
## 2021-08-10 ENCOUNTER — Ambulatory Visit (INDEPENDENT_AMBULATORY_CARE_PROVIDER_SITE_OTHER): Payer: 59 | Admitting: Psychiatry

## 2021-08-10 DIAGNOSIS — F4010 Social phobia, unspecified: Secondary | ICD-10-CM

## 2021-08-10 DIAGNOSIS — F17291 Nicotine dependence, other tobacco product, in remission: Secondary | ICD-10-CM

## 2021-08-10 DIAGNOSIS — F4310 Post-traumatic stress disorder, unspecified: Secondary | ICD-10-CM

## 2021-08-10 DIAGNOSIS — F3341 Major depressive disorder, recurrent, in partial remission: Secondary | ICD-10-CM | POA: Diagnosis not present

## 2021-08-10 DIAGNOSIS — F1211 Cannabis abuse, in remission: Secondary | ICD-10-CM

## 2021-08-10 NOTE — Progress Notes (Signed)
Psychotherapy Progress Note [facility] [name], PhD LP  Patient ID: Michelle Merritt)    MRN: [id number] Therapy format: Individual psychotherapy Date: 08/10/2021      Start: 4:05p     Stop: 4:53p     Time Spent: 48 min Location: In-person   Session narrative (presenting needs, interim history, self-report of stressors and symptoms, applications of prior therapy, status changes, and interventions made in session) Beach trip finished up well.  Went back with her own family, and new boyfriend [name].  One day had enough of needling from parents, retreated to the bedroom.  Confesses she really likes [name] a lot, trying not to let it race ahead and really fall for him.  He said the criticism she perceives from her parents isn't so apparent as all that, suggesting perhaps it is partly read in based on past pain.    Never did bring up to F his caustic outburst from last month.  Says he is still grumpy, super competitive, especially post-steroids and post-guys' weekend, and post-RMSF, and while paying family game of poker.  Got frustrated enough herself to sabotage her game and go out.  Overall, mom is more affirming of late, sees benefit from therapy, and father rather paradoxically sour in general.  Mother has been rather frank, actually, that alcoholism is in the family and that they, her parents, are both "functional alcoholics".  Mentions she has been vaping for 2 and half years now.  Thought better of it recently, stopped cold [place] 7/4, with significant distress but without relapsing.  Now noticing feeling short of breath, actually, but does not believe it is vape-related.  Interpreted probably withdrawal anxiety from lack of nicotine and correlated muscle tension restricting her breathing, unlikely any physiological dependence or vape-related lung condition.  Is able to use relaxing breaths effectively except during nic fits.  Advised she can either use  diaphragmatic breathing technique or walk them off as available.  Says she was diagnosed with asthma last fall as well, but these sensations are not the same.  Does have inhaler for that.  Sometimes uses a meditation pipe to pace her breathing, actually, which helps.  Has noticed conditioned cravings for while drinking, e.g., a White Claw, but is abstaining nonetheless.  Noticed that parents coming back home, and various moments of hanging out with them, are a trigger for cravings as well.  Insight in session that she has also had tremors and restlessness apparently related both to anxiety and nicotine withdrawal.  Assured the nicotine effect will fade, and anxiety sxs will reduce with further weaning and application of self-soothing techniques.  Noted also her first derealization panic attack since last summer, hyperventilating, helped by her friend Kendy by phone to breathe and ground herself, wanted to disappear then able   Apprehension coming up about flying to [place] with [name] and his family.  Brainstormed ways of breaking up her dread feeling.    Therapeutic modalities: Cognitive Behavioral Therapy and Solution-Oriented/Positive Psychology  Mental Status/Observations:  Appearance:   Casual     Behavior:  Appropriate  Motor:  Normal  Speech/Language:   Clear and Coherent  Affect:  Appropriate  Mood:  anxious  Thought process:  normal  Thought content:    WNL  Sensory/Perceptual disturbances:    WNL  Orientation:  Fully oriented  Attention:  Good    Concentration:  Good  Memory:  WNL  Insight:    Good  Judgment:   Good  Impulse  Control:  Good   Risk Assessment: Danger to Self: No Self-injurious Behavior: No Danger to Others: No Physical Aggression / Violence: No Duty to Warn: No Access to Firearms a concern: No  Assessment of progress:  progressing well  Diagnosis:   ICD-10-CM   1. Major depressive disorder, recurrent, in partial remission (HCC)  F33.41     2. Social  anxiety disorder  F40.10     3. Other tobacco product nicotine dependence in remission  F17.291     4. PTSD (post-traumatic stress disorder)  F43.10    improving, stable    5. 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      Plan:  Continue abstinence from nicotine vape, pot Manage alcohol use Tips for managing anxiety going away with BF and family Worth coming back to father's outburst -- same recommendation start with asking if he recalls, ask what he wanted to convey, be ready to let him know she well understands the lesson he meant to teach Later consideration of Al-Anon Continue to endorse discretion in dating, with emphasis on keeping sex scrupulously honest and clear about consent and knowing and setting her boundaries before allowing intoxication Continue to self-monitor for dissociative sxs and employ calming and grounding techniques if needed. Other recommendations/advice as may be noted above Continue to utilize previously learned skills ad lib Maintain medication as prescribed and work faithfully with relevant prescriber(s) if any changes are desired or seem indicated Call the clinic on-call service, 988/hotline, 911, or present to [facility] or ER if any life-threatening psychiatric crisis Return for session(s) already scheduled. Already scheduled visit in this office 08/23/2021.  [name], PhD [name], PhD LP Clinical Psychologist, [facility] [address] [place], [name] [address] [phone]

## 2021-08-20 NOTE — Progress Notes (Incomplete)
Psychotherapy Progress Note [facility] [name], PhD LP  Patient ID: Michelle Merritt)    MRN: [id number] Therapy format: Individual psychotherapy Date: 08/10/2021      Start: 4:05p     Stop: 4:53p     Time Spent: 48 min Location: In-person   Session narrative (presenting needs, interim history, self-report of stressors and symptoms, applications of prior therapy, status changes, and interventions made in session) Beach trip finished up well.  Went back with her own family, and new boyfriend Michelle Merritt.  One day had enough of needling from parents, retreated to the bedroom.  Confesses she really likes Michelle Merritt a lot, trying not to let it race ahead and really fall for him.  He said the criticism she perceives from her parents isn't so apparent as all that, suggesting perhaps it is partly read in based on past pain.    Never did bring up to F his caustic outburst from last month.  Says he is still grumpy, super competitive, especially post-steroids and post-guys' weekend, and post-RMSF, and while paying family game of poker.  Got frustrated enough herself to sabotage her game and go out.  Overall, mom is more affirming of late, sees benefit from therapy, and father rather paradoxically sour in general.  Mother has been rather frank, actually, that alcoholism is in the family and that they, her parents, are both "functional alcoholics".  Mentions she has been vaping for 2 and half years now.  Thought better of it recently, stopped cold [place] 7/4, with significant distress but without relapsing.  Now noticing feeling short of breath, actually, but does not believe it is vape-related.  Interpreted probably withdrawal anxiety from lack of nicotine and correlated muscle tension restricting her breathing, unlikely any physiological dependence or vape-related lung condition.  Is able to use relaxing breaths effectively except during nic fits.  Advised she can either use  diaphragmatic breathing technique or walk them off as available.  Says she was diagnosed with asthma last fall as well, but these sensations are not the same.  Does have inhaler for that.  Sometimes uses a meditation pipe to pace her breathing, actually, which helps.  Has noticed condirioned cravings for while drinking, e.g., a White Claw, but is abstaining nonetheless.  Noticed that parents coming back home, and various moments of hanging out with them, are a trigger for cravings as well.  Insight in session that she has also had tremors and restlessness apparently related both to anxiety and nicotine withdrawal.  Assured the nicotine effect will fade, and anxiety sxs will reduce with further weaning and application of self-soothing techniques.  Noted also her first derealization panic attack since last summer, hyperventilating, helped by her friend Michelle Merritt by phone to breathe and ground herself, wanted to disappear then able   Apprehension coming up about flying to [place] with Michelle Merritt and his family.  Brainstormed ways of breaking up her dread feeling.    Therapeutic modalities: Cognitive Behavioral Therapy and Solution-Oriented/Positive Psychology  Mental Status/Observations:  Appearance:   Casual     Behavior:  Appropriate  Motor:  Normal  Speech/Language:   Clear and Coherent  Affect:  Appropriate  Mood:  anxious  Thought process:  normal  Thought content:    WNL  Sensory/Perceptual disturbances:    WNL  Orientation:  Fully oriented  Attention:  Good    Concentration:  Good  Memory:  WNL  Insight:    Good  Judgment:   Good  Impulse  Control:  Good   Risk Assessment: Danger to Self: No Self-injurious Behavior: No Danger to Others: No Physical Aggression / Violence: No Duty to Warn: No Access to Firearms a concern: No  Assessment of progress:  progressing well  Diagnosis:   ICD-10-CM   1. Major depressive disorder, recurrent, in partial remission (HCC)  F33.41     2. Social  anxiety disorder  F40.10     3. Other tobacco product nicotine dependence in remission  F17.291     4. PTSD (post-traumatic stress disorder)  F43.10    improving, stable    5. 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 Already scheduled visit in this office 08/23/2021.  [name], PhD [name], PhD LP Clinical Psychologist, [facility] [address] [place], [name] [address] [phone]

## 2021-08-23 ENCOUNTER — Encounter: Payer: Self-pay | Admitting: Behavioral Health

## 2021-08-23 ENCOUNTER — Ambulatory Visit (INDEPENDENT_AMBULATORY_CARE_PROVIDER_SITE_OTHER): Payer: 59 | Admitting: Behavioral Health

## 2021-08-23 DIAGNOSIS — F3341 Major depressive disorder, recurrent, in partial remission: Secondary | ICD-10-CM | POA: Diagnosis not present

## 2021-08-23 DIAGNOSIS — F411 Generalized anxiety disorder: Secondary | ICD-10-CM

## 2021-08-23 DIAGNOSIS — F4010 Social phobia, unspecified: Secondary | ICD-10-CM

## 2021-08-23 DIAGNOSIS — F331 Major depressive disorder, recurrent, moderate: Secondary | ICD-10-CM

## 2021-08-23 DIAGNOSIS — F4310 Post-traumatic stress disorder, unspecified: Secondary | ICD-10-CM

## 2021-08-23 DIAGNOSIS — R45851 Suicidal ideations: Secondary | ICD-10-CM

## 2021-08-23 DIAGNOSIS — F4001 Agoraphobia with panic disorder: Secondary | ICD-10-CM

## 2021-08-23 DIAGNOSIS — F429 Obsessive-compulsive disorder, unspecified: Secondary | ICD-10-CM

## 2021-08-23 MED ORDER — LITHIUM CARBONATE 300 MG PO TABS: 300.0000 mg | ORAL_TABLET | Freq: Two times a day (BID) | ORAL | 3 refills | Status: DC

## 2021-08-23 MED ORDER — VENLAFAXINE HCL 100 MG PO TABS: 150.0000 mg | ORAL_TABLET | Freq: Every day | ORAL | 1 refills | Status: DC

## 2021-08-23 MED ORDER — LORAZEPAM 0.5 MG PO TABS: 0.5000 mg | ORAL_TABLET | Freq: Three times a day (TID) | ORAL | 1 refills | Status: DC

## 2021-08-23 NOTE — Progress Notes (Signed)
[facility]  Patient ID: Michelle Merritt,  MRN: [id number]  PCP: Derinda Late, MD  Date of Evaluation: 08/23/2021 Time spent:30 minutes  Chief Complaint:  Chief Complaint   Depression; Anxiety; Panic Attack; Follow-up; Patient Education; Medication Refill     HISTORY/CURRENT STATUS: HPI  21 year old female presents to this office for follow up and medication management. This visit pt is calm and appears to be a little more happy and content. She says that she recently broke up with her boyfriend in May and is now with a new man. Says that she did not plan to be with someone new, but is very happy. Says they have already met each others family. She is starting to experience some panic when faced with new environments. She feels overwhelmed and will start to dissociate. She says that it is very embarrassing for her. Says inderal not doing enough. She has had no SI in several months and is happy about how lithium has helped. Says her anxiety today is 2/10 and depression 2/10.  She is sleeping 7 plus hours at night. No current SI/ No HI. No mania, no psychosis. Agrees to f/u labs with [facility] to obtain Lithium levels every three months. No concerns with last Lithium levels.   Past psychiatric medication trials Sertraline Fluoxetine Citalopram  Xanax    Individual Medical History/ Review of Systems: Changes? :No   Allergies: Amoxicillin, Penicillins, Dextromethorphan, and Phenylephrine  Current Medications:  Current Outpatient Medications:    LORazepam (ATIVAN) 0.5 MG tablet, Take 1 tablet (0.5 mg total) by mouth every 8 (eight) hours., Disp: 15 tablet, Rfl: 1   Levonorgestrel (KYLEENA) 19.5 MG IUD, Kyleena 17.5 mcg/24 hrs (33yr) 19.568mintrauterine device  Take 1 device by intrauterine route., Disp: , Rfl:    lithium 300 MG tablet, Take 1 tablet (300 mg total) by mouth 2 (two) times daily., Disp: 60 tablet, Rfl: 3   NORGESTIMATE-ETH ESTRADIOL PO, Take by  mouth., Disp: , Rfl:    PROAIR RESPICLICK 1040090 Base) MCG/ACT AEPB, Inhale 2 puffs into the lungs every 4 (four) hours as needed., Disp: , Rfl:    propranolol (INDERAL) 20 MG tablet, propranolol 20 mg tablet  TAKE 1 TO 2 TABLETS BY MOUTH EVERY 6 HOURS AS NEEDED FOR ANXIETY, Disp: , Rfl:    propranolol (INDERAL) 20 MG tablet, Take 1 tablet (20 mg total) by mouth 3 (three) times daily., Disp: 90 tablet, Rfl: 2   SUMAtriptan (IMITREX) 100 MG tablet, sumatriptan 100 mg tablet  TAKE 1/2 1 TABLET AS NEEDED FOR MIGRAINE. MAY REPEAT IN 2 HRS AS NEEDED DO NOT EXCEED 2 IN 24 HOURSD, Disp: , Rfl:    venlafaxine (EFFEXOR) 100 MG tablet, Take 1.5 tablets (150 mg total) by mouth daily after breakfast., Disp: 135 tablet, Rfl: 1 Medication Side Effects: anxiety  Family Medical/ Social History: Changes? No  MENTAL HEALTH EXAM:  There were no vitals taken for this visit.There is no height or weight on file to calculate BMI.  General Appearance: Casual, Neat, and Well Groomed  Eye Contact:  Good  Speech:  Clear and Coherent  Volume:  Normal  Mood:  Anxious  Affect:  Appropriate  Thought Process:  Coherent  Orientation:  Full (Time, Place, and Person)  Thought Content: Logical   Suicidal Thoughts:  No  Homicidal Thoughts:  No  Memory:  WNL  Judgement:  Good  Insight:  Good  Psychomotor Activity:  Normal  Concentration:  Concentration: Good  Recall:  Good  Fund of Knowledge: Good  Language: Good  Assets:  Desire for Improvement  ADL's:  Intact  Cognition: WNL  Prognosis:  Good    DIAGNOSES:    ICD-10-CM   1. Generalized anxiety disorder  F41.1 LORazepam (ATIVAN) 0.5 MG tablet    lithium 300 MG tablet    venlafaxine (EFFEXOR) 100 MG tablet    2. Major depressive disorder, recurrent, in partial remission ([place])  F33.41     3. Social anxiety disorder  F40.10     4. PTSD (post-traumatic stress disorder)  F43.10     5. Obsessive-compulsive disorder, unspecified type  F42.9 lithium 300 MG  tablet    venlafaxine (EFFEXOR) 100 MG tablet    6. Panic disorder with agoraphobia  F40.01 LORazepam (ATIVAN) 0.5 MG tablet    venlafaxine (EFFEXOR) 100 MG tablet    7. Suicidal ideation  R45.851 lithium 300 MG tablet    8. Major depressive disorder, recurrent episode, moderate (HCC)  F33.1 lithium 300 MG tablet      Receiving Psychotherapy: No    RECOMMENDATIONS:   To start #15 Ativan 0.5 mg for severe anxiety or panic. Patient understands to use sparingly.  Continue Lithium 300 mg twice daily (600 mg total). Obtain lithium levels next visit.  Continue Venlafaxine 150 mg daily Stopped Buspar Continue Inderal 20 mg twice daily as needed. Will report worsening symptoms promptly. Follow up in 4 weeks to reassess. Reinforced after hours emergency number and suicide hotline.  Greater than 50% of 30 min face to face time with patient was spent on counseling and coordination of care. We discussed continued symptoms of anxiety, ocd, PMDD, and stressors causing exacerbation of symptoms.  Discussed efficacy with her medications. She is progressing very well and not reporting unmanageable levels on anxiety and depression this visit. Still struggles with OCD aspect. She usually straightens things out in my office. She centered the tissue box on table this visit.  She is having some mild, fleeting suicidal ideation since last visit, but no plans and she reinforces that she would not act on anything. Lithium continues to stabilize this. She feels more secure with the Lithium on board and does not want to consider decrease at this time.  Discussed potential benefits, risk, and side effects of benzodiazepines to include potential risk of tolerance and dependence, as well as possible drowsiness.  Advised patient not to drive if experiencing drowsiness and to take lowest possible effective dose to minimize risk of dependence and tolerance.  She will continue in therapy. She verbally contracted for  safety. Reviewed PDMP    Elwanda Brooklyn, NP

## 2021-08-24 ENCOUNTER — Ambulatory Visit: Payer: 59 | Admitting: Psychiatry

## 2021-09-01 ENCOUNTER — Ambulatory Visit: Payer: 59 | Admitting: Behavioral Health

## 2021-09-07 ENCOUNTER — Ambulatory Visit: Payer: 59 | Admitting: Psychiatry

## 2021-09-20 ENCOUNTER — Encounter: Payer: Self-pay | Admitting: Behavioral Health

## 2021-09-20 ENCOUNTER — Telehealth (INDEPENDENT_AMBULATORY_CARE_PROVIDER_SITE_OTHER): Payer: 59 | Admitting: Behavioral Health

## 2021-09-20 ENCOUNTER — Ambulatory Visit: Payer: 59 | Admitting: Psychiatry

## 2021-09-20 DIAGNOSIS — F4310 Post-traumatic stress disorder, unspecified: Secondary | ICD-10-CM

## 2021-09-20 DIAGNOSIS — F3341 Major depressive disorder, recurrent, in partial remission: Secondary | ICD-10-CM

## 2021-09-20 DIAGNOSIS — F4001 Agoraphobia with panic disorder: Secondary | ICD-10-CM

## 2021-09-20 DIAGNOSIS — F411 Generalized anxiety disorder: Secondary | ICD-10-CM | POA: Diagnosis not present

## 2021-09-20 NOTE — Progress Notes (Signed)
Hevin Jeffcoat [id number] 2000/03/14 21 y.o.  Virtual Visit via Video Note  I connected with pt @ on 09/20/21 at 10:00 AM EDT by a video enabled telemedicine application and verified that I am speaking with the correct person using two identifiers.   I discussed the limitations of evaluation and management by telemedicine and the availability of in person appointments. The patient expressed understanding and agreed to proceed.  I discussed the assessment and treatment plan with the patient. The patient was provided an opportunity to ask questions and all were answered. The patient agreed with the plan and demonstrated an understanding of the instructions.   The patient was advised to call back or seek an in-person evaluation if the symptoms worsen or if the condition fails to improve as anticipated.  I provided 30  minutes of non-face-to-face time during this encounter.  The patient was located at home.  The provider was located at [facility].   Joan Flores, NP   Subjective:   Patient ID:  Michelle Merritt is a 21 y.o. (DOB September 26, 2000) female.  Chief Complaint:  Chief Complaint  Patient presents with   Depression   Anxiety   Follow-up   Medication Problem   Medication Refill   Patient Education    HPI 21 year old female presents via video visit for follow up and medication management.  This visit pt is calm and appears to be a little more happy and content. She is currently at the beach on [place] vacation. She and her new boyfriend are still doing well. She is concerned about weight gain. Says her PCP believes it is from the Lithium.  She is going to go on Ozempic. She just took the L SAT exam while on vacation. She is starting back at [employer].  She has had no SI in several months and is happy about how lithium has helped. Says her anxiety today is 2/10 and depression 2/10.  She is sleeping 7 plus hours at night. No current SI/ No HI. No mania, no  psychosis. Agrees to f/u labs with [facility] to obtain Lithium levels every three months. No concerns with last Lithium levels.   Past psychiatric medication trials Sertraline Fluoxetine Citalopram  Xanax   Review of Systems:  Review of Systems  Medications: I have reviewed the patient's current medications.  Current Outpatient Medications  Medication Sig Dispense Refill   Levonorgestrel (KYLEENA) 19.5 MG IUD Kyleena 17.5 mcg/24 hrs (25yrs) 19.5mg  intrauterine device  Take 1 device by intrauterine route.     lithium 300 MG tablet Take 1 tablet (300 mg total) by mouth 2 (two) times daily. 60 tablet 3   LORazepam (ATIVAN) 0.5 MG tablet Take 1 tablet (0.5 mg total) by mouth every 8 (eight) hours. 15 tablet 1   NORGESTIMATE-ETH ESTRADIOL PO Take by mouth.     PROAIR RESPICLICK 108 (90 Base) MCG/ACT AEPB Inhale 2 puffs into the lungs every 4 (four) hours as needed.     propranolol (INDERAL) 20 MG tablet propranolol 20 mg tablet  TAKE 1 TO 2 TABLETS BY MOUTH EVERY 6 HOURS AS NEEDED FOR ANXIETY     propranolol (INDERAL) 20 MG tablet Take 1 tablet (20 mg total) by mouth 3 (three) times daily. 90 tablet 2   SUMAtriptan (IMITREX) 100 MG tablet sumatriptan 100 mg tablet  TAKE 1/2 1 TABLET AS NEEDED FOR MIGRAINE. MAY REPEAT IN 2 HRS AS NEEDED DO NOT EXCEED 2 IN 24 HOURSD     venlafaxine (  EFFEXOR) 100 MG tablet Take 1.5 tablets (150 mg total) by mouth daily after breakfast. 135 tablet 1   No current facility-administered medications for this visit.    Medication Side Effects: None  Allergies:  Allergies  Allergen Reactions   Amoxicillin Hives   Penicillins Hives   Dextromethorphan Other (See Comments)   Phenylephrine Other (See Comments)    Past Medical History:  Diagnosis Date   Anxiety     No family history on file.  Social History   Socioeconomic History   Marital status: Single    Spouse name: Not on file   Number of children: Not on file   Years of education: Not on file    Highest education level: Not on file  Occupational History   Not on file  Tobacco Use   Smoking status: Former    Types: E-cigarettes    Start date: 2021    Quit date: 2022    Years since quitting: 1.6   Smokeless tobacco: Never   Tobacco comments:    some days smoker  Vaping Use   Vaping Use: Some days  Substance and Sexual Activity   Alcohol use: Not Currently    Comment: soc   Drug use: Not Currently    Types: Marijuana   Sexual activity: Not on file  Other Topics Concern   Not on file  Social History Narrative   Not on file   Social Determinants of Health   Financial Resource Strain: Not on file  Food Insecurity: Not on file  Transportation Needs: Not on file  Physical Activity: Not on file  Stress: Not on file  Social Connections: Not on file  Intimate Partner Violence: Not on file    Past Medical History, Surgical history, Social history, and Family history were reviewed and updated as appropriate.   Please see review of systems for further details on the patient's review from today.   Objective:   Physical Exam:  There were no vitals taken for this visit.  Physical Exam  Lab Review:     Component Value Date/Time   NA 140 01/23/2019 2014   K 4.1 01/23/2019 2014   CL 103 01/23/2019 2014   CO2 27 01/23/2019 2014   GLUCOSE 99 01/23/2019 2014   BUN 8 01/23/2019 2014   CREATININE 0.59 01/23/2019 2014   CALCIUM 9.9 01/23/2019 2014   PROT 8.1 01/23/2019 2014   ALBUMIN 5.0 01/23/2019 2014   AST 32 01/23/2019 2014   ALT 25 01/23/2019 2014   ALKPHOS 67 01/23/2019 2014   BILITOT 0.9 01/23/2019 2014   GFRNONAA >60 01/23/2019 2014   GFRAA >60 01/23/2019 2014       Component Value Date/Time   WBC 7.4 01/23/2019 1927   RBC 4.55 01/23/2019 1927   HGB 13.7 01/23/2019 1927   HCT 40.6 01/23/2019 1927   PLT 248 01/23/2019 1927   MCV 89.2 01/23/2019 1927   MCH 30.1 01/23/2019 1927   MCHC 33.7 01/23/2019 1927   RDW 12.5 01/23/2019 1927   LYMPHSABS 3.1  01/23/2019 1927   MONOABS 0.7 01/23/2019 1927   EOSABS 0.1 01/23/2019 1927   BASOSABS 0.1 01/23/2019 1927    Lithium Lvl  Date Value Ref Range Status  05/24/2021 0.4 (L) 0.6 - 1.2 mmol/L Final     No results found for: "PHENYTOIN", "PHENOBARB", "VALPROATE", "CBMZ"   .res Assessment: Plan:     To start #15 Ativan 0.5 mg for severe anxiety or panic. Patient understands to use sparingly.  Continue  Lithium 300 mg twice daily (600 mg total). Obtain lithium levels next visit.  Continue Venlafaxine 150 mg daily Continue Inderal 20 mg twice daily as needed. Will report worsening symptoms promptly. Follow up in 2 months to reassess. Reinforced after hours emergency number and suicide hotline.  Greater than 50% of 30 min face to face time with patient was spent on counseling and coordination of care. We discussed continued symptoms of anxiety, ocd, PMDD, and stressors causing exacerbation of symptoms.  Discussed efficacy with her medications. She is progressing very well and not reporting unmanageable levels on anxiety and depression this visit. Still struggles with OCD aspect.  No SI since last visit. Discussed potential benefits, risk, and side effects of benzodiazepines to include potential risk of tolerance and dependence, as well as possible drowsiness.  Advised patient not to drive if experiencing drowsiness and to take lowest possible effective dose to minimize risk of dependence and tolerance.  She will continue in therapy. She verbally contracted for safety. Reviewed PDMP       Michelle Merritt was seen today for depression, anxiety, follow-up, medication problem, medication refill and patient education.  Diagnoses and all orders for this visit:  Generalized anxiety disorder  PTSD (post-traumatic stress disorder)  Major depressive disorder, recurrent, in partial remission (HCC)  Panic disorder with agoraphobia     Please see After Visit Summary for patient specific  instructions.  Future Appointments  Date Time Provider Department Center  10/12/2021  8:00 AM [name], PhD CP-[facility] None  10/27/2021  5:00 PM [name], PhD [facility] None  11/04/2021  1:00 PM Joan Flores, NP [facility] None  11/10/2021  6:00 PM Mitchum, Molly Maduro, PhD [facility] None    No orders of the defined types were placed in this encounter.     -------------------------------

## 2021-10-05 ENCOUNTER — Ambulatory Visit: Payer: 59 | Admitting: Psychiatry

## 2021-10-12 ENCOUNTER — Ambulatory Visit: Payer: 59 | Admitting: Psychiatry

## 2021-10-12 NOTE — Progress Notes (Deleted)
Psychotherapy Progress Note [facility] [name], PhD LP  Patient ID: Aliea Bobe)    MRN: [id number] Therapy format: {Therapy Types:21967::"Individual psychotherapy"} Date: 10/12/2021      Start: ***:***     Stop: ***:***     Time Spent: *** min Location: {SvcLoc:22530::"In-person"}   Session narrative (presenting needs, interim history, self-report of stressors and symptoms, applications of prior therapy, status changes, and interventions made in session) ***  Therapeutic modalities: {AM:23362::"Cognitive Behavioral Therapy","Solution-Oriented/Positive Psychology"}  Mental Status/Observations:  Appearance:   {PSY:22683}     Behavior:  {PSY:21022743}  Motor:  {PSY:22302}  Speech/Language:   {PSY:22685}  Affect:  {PSY:22687}  Mood:  {PSY:31886}  Thought process:  {PSY:31888}  Thought content:    {PSY:8630861767}  Sensory/Perceptual disturbances:    {PSY:[phone]}  Orientation:  {Psych Orientation:23301::"Fully oriented"}  Attention:  {Good-Fair-Poor ratings:23770::"Good"}    Concentration:  {Good-Fair-Poor ratings:23770::"Good"}  Memory:  {[phone]}  Insight:    {Good-Fair-Poor ratings:23770::"Good"}  Judgment:   {Good-Fair-Poor ratings:23770::"Good"}  Impulse Control:  {Good-Fair-Poor ratings:23770::"Good"}   Risk Assessment: Danger to Self: {Risk:22599::"No"} Self-injurious Behavior: {Risk:22599::"No"} Danger to Others: {Risk:22599::"No"} Physical Aggression / Violence: {Risk:22599::"No"} Duty to Warn: {AMYesNo:22526::"No"} Access to Firearms a concern: {AMYesNo:22526::"No"}  Assessment of progress:  {Progress:22147::"progressing"}  Diagnosis: No diagnosis found. Plan:  *** Other recommendations/advice as may be noted above Continue to utilize previously learned skills ad lib Maintain medication as prescribed and work faithfully with relevant prescriber(s) if any changes are desired or seem indicated Call the  clinic on-call service, 988/hotline, 911, or present to [facility] or ER if any life-threatening psychiatric crisis No follow-ups on file. Already scheduled visit in this office 10/27/2021.  [name], PhD [name], PhD LP Clinical Psychologist, [facility] [address] [place], [name] [address] [phone]

## 2021-10-27 ENCOUNTER — Ambulatory Visit (INDEPENDENT_AMBULATORY_CARE_PROVIDER_SITE_OTHER): Payer: 59 | Admitting: Psychiatry

## 2021-10-27 DIAGNOSIS — F3341 Major depressive disorder, recurrent, in partial remission: Secondary | ICD-10-CM

## 2021-10-27 DIAGNOSIS — F411 Generalized anxiety disorder: Secondary | ICD-10-CM | POA: Diagnosis not present

## 2021-10-27 DIAGNOSIS — Z91410 Personal history of adult physical and sexual abuse: Secondary | ICD-10-CM

## 2021-10-27 DIAGNOSIS — R69 Illness, unspecified: Secondary | ICD-10-CM

## 2021-10-27 DIAGNOSIS — F1211 Cannabis abuse, in remission: Secondary | ICD-10-CM

## 2021-10-27 DIAGNOSIS — F4010 Social phobia, unspecified: Secondary | ICD-10-CM

## 2021-10-27 DIAGNOSIS — F4310 Post-traumatic stress disorder, unspecified: Secondary | ICD-10-CM | POA: Diagnosis not present
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
## 2021-11-04 ENCOUNTER — Telehealth (INDEPENDENT_AMBULATORY_CARE_PROVIDER_SITE_OTHER): Payer: 59 | Admitting: Behavioral Health

## 2021-11-04 ENCOUNTER — Encounter: Payer: Self-pay | Admitting: Behavioral Health

## 2021-11-04 DIAGNOSIS — Z79899 Other long term (current) drug therapy: Secondary | ICD-10-CM | POA: Diagnosis not present

## 2021-11-04 DIAGNOSIS — F4001 Agoraphobia with panic disorder: Secondary | ICD-10-CM | POA: Diagnosis not present

## 2021-11-04 DIAGNOSIS — F429 Obsessive-compulsive disorder, unspecified: Secondary | ICD-10-CM | POA: Diagnosis not present

## 2021-11-04 DIAGNOSIS — F411 Generalized anxiety disorder: Secondary | ICD-10-CM

## 2021-11-04 MED ORDER — VENLAFAXINE HCL 100 MG PO TABS: 150.0000 mg | ORAL_TABLET | Freq: Every day | ORAL | 1 refills | Status: DC

## 2021-11-04 NOTE — Progress Notes (Addendum)
[name] [id number] 2001/01/13 21 y.o.  Virtual Visit via Video Note  I connected with pt @ on 11/04/21 at  1:00 PM EDT by a video enabled telemedicine application and verified that I am speaking with the correct person using two identifiers.   I discussed the limitations of evaluation and management by telemedicine and the availability of in person appointments. The patient expressed understanding and agreed to proceed.  I discussed the assessment and treatment plan with the patient. The patient was provided an opportunity to ask questions and all were answered. The patient agreed with the plan and demonstrated an understanding of the instructions.   The patient was advised to call back or seek an in-person evaluation if the symptoms worsen or if the condition fails to improve as anticipated.  I provided 30  minutes of non-face-to-face time during this encounter.  The patient was located at home.  The provider was located at [place].   Elwanda [place], NP   Subjective:   Patient ID:  Michelle Merritt is a 21 y.o. (DOB 08-01-2000) female.  Chief Complaint:  Chief Complaint  Patient presents with   Anxiety   Depression   Follow-up   Medication Refill   Patient Education    HPI 21 year old female presents via video visit for follow up and medication management.  No changes this visit. This visit pt is calm and appears to be a little more happy and content. Says she has lost 17 lbs with Ozempic.  She feels like she is a good place now with life. Says she finally feels normal. She is very involved with boyfriend and his family. She has had no SI in several months and is happy about how lithium has helped. Says her anxiety today is 2/10 and depression 2/10.  She is sleeping 7 plus hours at night. No current SI/ No HI. No mania, no psychosis. Agrees to f/u labs with [facility] to obtain Lithium levels every three months. No concerns with last Lithium  levels.   Past psychiatric medication trials Sertraline Fluoxetine Citalopram  Xanax   Review of Systems:  Review of Systems  Constitutional: Negative.   Allergic/Immunologic: Negative.   Psychiatric/Behavioral:  Positive for dysphoric mood. The patient is nervous/anxious.     Medications: I have reviewed the patient's current medications.  Current Outpatient Medications  Medication Sig Dispense Refill   Levonorgestrel (KYLEENA) 19.5 MG IUD Kyleena 17.5 mcg/24 hrs (42yrs) 19.5mg  intrauterine device  Take 1 device by intrauterine route.     lithium 300 MG tablet Take 1 tablet (300 mg total) by mouth 2 (two) times daily. 60 tablet 3   LORazepam (ATIVAN) 0.5 MG tablet Take 1 tablet (0.5 mg total) by mouth every 8 (eight) hours. 15 tablet 1   NORGESTIMATE-ETH ESTRADIOL PO Take by mouth.     PROAIR RESPICLICK 123XX123 (90 Base) MCG/ACT AEPB Inhale 2 puffs into the lungs every 4 (four) hours as needed.     propranolol (INDERAL) 20 MG tablet propranolol 20 mg tablet  TAKE 1 TO 2 TABLETS BY MOUTH EVERY 6 HOURS AS NEEDED FOR ANXIETY     propranolol (INDERAL) 20 MG tablet Take 1 tablet (20 mg total) by mouth 3 (three) times daily. 90 tablet 2   SUMAtriptan (IMITREX) 100 MG tablet sumatriptan 100 mg tablet  TAKE 1/2 1 TABLET AS NEEDED FOR MIGRAINE. MAY REPEAT IN 2 HRS AS NEEDED DO NOT EXCEED 2 IN 24 HOURSD     venlafaxine (EFFEXOR) 100  MG tablet Take 1.5 tablets (150 mg total) by mouth daily after breakfast. 135 tablet 1   No current facility-administered medications for this visit.    Medication Side Effects: None  Allergies:  Allergies  Allergen Reactions   Amoxicillin Hives   Penicillins Hives   Dextromethorphan Other (See Comments)   Phenylephrine Other (See Comments)    Past Medical History:  Diagnosis Date   Anxiety     History reviewed. No pertinent family history.  Social History   Socioeconomic History   Marital status: Single    Spouse name: Not on file   Number of  children: Not on file   Years of education: Not on file   Highest education level: Not on file  Occupational History   Not on file  Tobacco Use   Smoking status: Former    Types: E-cigarettes    Start date: 2021    Quit date: 2022    Years since quitting: 1.7   Smokeless tobacco: Never   Tobacco comments:    some days smoker  Vaping Use   Vaping Use: Some days  Substance and Sexual Activity   Alcohol use: Not Currently    Comment: soc   Drug use: Not Currently    Types: Marijuana   Sexual activity: Not on file  Other Topics Concern   Not on file  Social History Narrative   Not on file   Social Determinants of Health   Financial Resource Strain: Not on file  Food Insecurity: Not on file  Transportation Needs: Not on file  Physical Activity: Not on file  Stress: Not on file  Social Connections: Not on file  Intimate Partner Violence: Not on file    Past Medical History, Surgical history, Social history, and Family history were reviewed and updated as appropriate.   Please see review of systems for further details on the patient's review from today.   Objective:   Physical Exam:  There were no vitals taken for this visit.  Physical Exam Neurological:     Mental Status: She is alert and oriented to person, place, and time.  Psychiatric:        Attention and Perception: Attention and perception normal.        Mood and Affect: Mood normal.        Speech: Speech normal.        Behavior: Behavior normal. Behavior is cooperative.        Cognition and Memory: Cognition and memory normal.        Judgment: Judgment normal.     Comments: Insight intact     Lab Review:     Component Value Date/Time   NA 140 01/23/2019 2014   K 4.1 01/23/2019 2014   CL 103 01/23/2019 2014   CO2 27 01/23/2019 2014   GLUCOSE 99 01/23/2019 2014   BUN 8 01/23/2019 2014   CREATININE 0.59 01/23/2019 2014   CALCIUM 9.9 01/23/2019 2014   PROT 8.1 01/23/2019 2014   ALBUMIN 5.0  01/23/2019 2014   AST 32 01/23/2019 2014   ALT 25 01/23/2019 2014   ALKPHOS 67 01/23/2019 2014   BILITOT 0.9 01/23/2019 2014   GFRNONAA >60 01/23/2019 2014   GFRAA >60 01/23/2019 2014       Component Value Date/Time   WBC 7.4 01/23/2019 1927   RBC 4.55 01/23/2019 1927   HGB 13.7 01/23/2019 1927   HCT 40.6 01/23/2019 1927   PLT 248 01/23/2019 1927   MCV 89.2 01/23/2019 1927  MCH 30.1 01/23/2019 1927   MCHC 33.7 01/23/2019 1927   RDW 12.5 01/23/2019 1927   LYMPHSABS 3.1 01/23/2019 1927   MONOABS 0.7 01/23/2019 1927   EOSABS 0.1 01/23/2019 1927   BASOSABS 0.1 01/23/2019 1927    Lithium Lvl  Date Value Ref Range Status  05/24/2021 0.4 (L) 0.6 - 1.2 mmol/L Final     No results found for: "PHENYTOIN", "PHENOBARB", "VALPROATE", "CBMZ"   .res Assessment: Plan:     Recommendations  To start #15 Ativan 0.5 mg for severe anxiety or panic. Patient understands to use sparingly.  Continue Lithium 300 mg twice daily (600 mg total). Continue Venlafaxine 150 mg daily Lithium Levels next visit.  Continue Inderal 20 mg twice daily as needed. Will report worsening symptoms promptly. Follow up in 3 months to reassess. Reinforced after hours emergency number and suicide hotline.  Greater than 50% of 30 min face to face time with patient was spent on counseling and coordination of care. We discussed continued symptoms of anxiety, ocd, PMDD, and stressors causing exacerbation of symptoms.  Discussed efficacy with her medications. She is progressing very well and not reporting unmanageable levels on anxiety and depression this visit. Still struggles with OCD aspect.  No SI since last visit. Discussed potential benefits, risk, and side effects of benzodiazepines to include potential risk of tolerance and dependence, as well as possible drowsiness.  Advised patient not to drive if experiencing drowsiness and to take lowest possible effective dose to minimize risk of dependence and tolerance.   She will continue in therapy. She verbally contracted for safety. Reviewed PDMP    Louanna Raw. Thanvi Blincoe, NP  Zai was seen today for anxiety, depression, follow-up, medication refill and patient education.  Diagnoses and all orders for this visit:  High risk medication use -     Lithium level  Generalized anxiety disorder -     venlafaxine (EFFEXOR) 100 MG tablet; Take 1.5 tablets (150 mg total) by mouth daily after breakfast.  Obsessive-compulsive disorder, unspecified type -     venlafaxine (EFFEXOR) 100 MG tablet; Take 1.5 tablets (150 mg total) by mouth daily after breakfast.  Panic disorder with agoraphobia -     venlafaxine (EFFEXOR) 100 MG tablet; Take 1.5 tablets (150 mg total) by mouth daily after breakfast.     Please see After Visit Summary for patient specific instructions.  Future Appointments  Date Time Provider [place]  11/10/2021  6:00 PM Blanchie Serve, PhD CP-CP None  12/02/2021  2:00 PM Blanchie Serve, PhD CP-CP None  01/13/2022  4:00 PM Blanchie Serve, PhD CP-CP None  01/27/2022  2:00 PM Blanchie Serve, PhD CP-CP None    Orders Placed This Encounter  Procedures   Lithium level      -------------------------------

## 2021-11-05 ENCOUNTER — Encounter (HOSPITAL_BASED_OUTPATIENT_CLINIC_OR_DEPARTMENT_OTHER): Payer: Self-pay

## 2021-11-05 DIAGNOSIS — G4710 Hypersomnia, unspecified: Secondary | ICD-10-CM

## 2021-11-10 ENCOUNTER — Ambulatory Visit (INDEPENDENT_AMBULATORY_CARE_PROVIDER_SITE_OTHER): Payer: 59 | Admitting: Psychiatry

## 2021-11-10 DIAGNOSIS — Z91410 Personal history of adult physical and sexual abuse: Secondary | ICD-10-CM

## 2021-11-10 DIAGNOSIS — F411 Generalized anxiety disorder: Secondary | ICD-10-CM | POA: Diagnosis not present

## 2021-11-10 DIAGNOSIS — F1211 Cannabis abuse, in remission: Secondary | ICD-10-CM

## 2021-11-10 DIAGNOSIS — F4310 Post-traumatic stress disorder, unspecified: Secondary | ICD-10-CM | POA: Diagnosis not present

## 2021-11-10 DIAGNOSIS — F4010 Social phobia, unspecified: Secondary | ICD-10-CM | POA: Diagnosis not present

## 2021-11-10 DIAGNOSIS — R69 Illness, unspecified: Secondary | ICD-10-CM

## 2021-11-10 DIAGNOSIS — F3341 Major depressive disorder, recurrent, in partial remission: Secondary | ICD-10-CM
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
## 2021-11-22 ENCOUNTER — Telehealth: Payer: Self-pay | Admitting: Behavioral Health

## 2021-11-22 NOTE — Telephone Encounter (Signed)
Called pt about Lithium level labs well over due. Had spoke with pt 10/5 advising this information. She had apt with Andy 10/11 and would pick up lab order then. No sign of pt getting labs. LVM today asking address to mail or fax to location she would be going to.

## 2021-11-27 ENCOUNTER — Emergency Department (HOSPITAL_BASED_OUTPATIENT_CLINIC_OR_DEPARTMENT_OTHER)
Admission: EM | Admit: 2021-11-27 | Discharge: 2021-11-27 | Disposition: A | Discharge status: Home / Self Care | Payer: 59 | Attending: Emergency Medicine | Admitting: Emergency Medicine

## 2021-11-27 ENCOUNTER — Other Ambulatory Visit: Payer: Self-pay

## 2021-11-27 ENCOUNTER — Encounter (HOSPITAL_BASED_OUTPATIENT_CLINIC_OR_DEPARTMENT_OTHER): Payer: Self-pay

## 2021-11-27 DIAGNOSIS — R1013 Epigastric pain: Secondary | ICD-10-CM | POA: Diagnosis not present

## 2021-11-27 DIAGNOSIS — R112 Nausea with vomiting, unspecified: Secondary | ICD-10-CM | POA: Diagnosis present

## 2021-11-27 LAB — URINALYSIS, ROUTINE W REFLEX MICROSCOPIC
Bilirubin Urine: NEGATIVE
Glucose, UA: NEGATIVE mg/dL
Hgb urine dipstick: NEGATIVE
Ketones, ur: NEGATIVE mg/dL
Leukocytes,Ua: NEGATIVE
Nitrite: NEGATIVE
Protein, ur: 30 mg/dL — AB
Specific Gravity, Urine: 1.019 (ref 1.005–1.030)
pH: 8 (ref 5.0–8.0)

## 2021-11-27 LAB — COMPREHENSIVE METABOLIC PANEL
ALT: 10 U/L (ref 0–44)
AST: 17 U/L (ref 15–41)
Albumin: 5.1 g/dL — ABNORMAL HIGH (ref 3.5–5.0)
Alkaline Phosphatase: 59 U/L (ref 38–126)
Anion gap: 13 (ref 5–15)
BUN: 7 mg/dL (ref 6–20)
CO2: 23 mmol/L (ref 22–32)
Calcium: 9.7 mg/dL (ref 8.9–10.3)
Chloride: 105 mmol/L (ref 98–111)
Creatinine, Ser: 0.63 mg/dL (ref 0.44–1.00)
GFR, Estimated: >60 mL/min (ref >60)
Glucose, Bld: 91 mg/dL (ref 70–99)
Potassium: 3.9 mmol/L (ref 3.5–5.1)
Sodium: 141 mmol/L (ref 135–145)
Total Bilirubin: 0.4 mg/dL (ref 0.3–1.2)
Total Protein: 7.9 g/dL (ref 6.5–8.1)

## 2021-11-27 LAB — CBC
HCT: 40.4 % (ref 36.0–46.0)
Hemoglobin: 13.7 g/dL (ref 12.0–15.0)
MCH: 29.8 pg (ref 26.0–34.0)
MCHC: 33.9 g/dL (ref 30.0–36.0)
MCV: 87.8 fL (ref 80.0–100.0)
Platelets: 299 10*3/uL (ref 150–400)
RBC: 4.6 MIL/uL (ref 3.87–5.11)
RDW: 11.9 % (ref 11.5–15.5)
WBC: 6.6 10*3/uL (ref 4.0–10.5)
nRBC: 0 % (ref 0.0–0.2)

## 2021-11-27 LAB — LIPASE, BLOOD: Lipase: 18 U/L (ref 11–51)

## 2021-11-27 LAB — PREGNANCY, URINE: Preg Test, Ur: NEGATIVE

## 2021-11-27 MED ORDER — ACETAMINOPHEN 325 MG PO TABS
650.0000 mg | ORAL_TABLET | Freq: Once | ORAL | Status: AC
  Administered 2021-11-27: 650 mg via ORAL
  Filled 2021-11-27: qty 2

## 2021-11-27 MED ORDER — ONDANSETRON HCL 4 MG PO TABS: 4.0000 mg | ORAL_TABLET | Freq: Four times a day (QID) | ORAL | 0 refills | Status: AC

## 2021-11-27 MED ORDER — ONDANSETRON HCL 4 MG/2ML IJ SOLN
4.0000 mg | Freq: Once | INTRAMUSCULAR | Status: AC
  Administered 2021-11-27: 4 mg via INTRAVENOUS
  Filled 2021-11-27: qty 2

## 2021-11-27 MED ORDER — SODIUM CHLORIDE 0.9 % IV BOLUS
1000.0000 mL | Freq: Once | INTRAVENOUS | Status: AC
  Administered 2021-11-27: 1000 mL via INTRAVENOUS

## 2021-11-27 NOTE — ED Notes (Signed)
Pt given crackers and water for PO challenge

## 2021-11-27 NOTE — ED Notes (Signed)
Pt reports constant nausea with many days of diarrhea and almost daily intermittent vomiting for past 2 months.

## 2021-11-27 NOTE — ED Provider Notes (Signed)
[facility] EMERGENCY DEPT Provider Note   CSN: [id number] Arrival date & time: 11/27/21  1043     History {Add pertinent medical, surgical, social history, OB history to HPI:1} Chief Complaint  Patient presents with  . Hematemesis  . Nausea  . Diarrhea    Michelle Merritt is a 21 y.o. female with a past medical history of anxiety presenting to the emergency department for evaluation of hematemesis.  Patient states she was started on Ozempic about 8 weeks ago for weight loss.  Since then she has been having intermittent nausea with vomiting about 3 nights per week.  She also has associated constipation and diarrhea with mild lower belly pain.  States she has lost about 20 pounds in the last 2 months. This morning she noticed about 2 tablespoons of blood in her vomit prompting her to go to the emergency department for evaluation.  Denies any dark stools, dark urine.  Denies having any fever, chest pain, shortness of breath, urinary symptoms, rash.   Diarrhea Associated symptoms: vomiting        Home Medications Prior to Admission medications   Medication Sig Start Date End Date Taking? Authorizing Provider  Levonorgestrel (KYLEENA) 19.5 MG IUD Kyleena 17.5 mcg/24 hrs (37yrs) 19.5mg  intrauterine device  Take 1 device by intrauterine route.    [provider]  lithium 300 MG tablet Take 1 tablet (300 mg total) by mouth 2 (two) times daily. 08/23/21   Joan Flores, NP  LORazepam (ATIVAN) 0.5 MG tablet Take 1 tablet (0.5 mg total) by mouth every 8 (eight) hours. 08/23/21   White, Watt Climes, NP  NORGESTIMATE-ETH ESTRADIOL PO Take by mouth.    [provider]  PROAIR RESPICLICK 108 (90 Base) MCG/ACT AEPB Inhale 2 puffs into the lungs every 4 (four) hours as needed. 09/08/20   [provider]  propranolol (INDERAL) 20 MG tablet propranolol 20 mg tablet  TAKE 1 TO 2 TABLETS BY MOUTH EVERY 6 HOURS AS NEEDED FOR ANXIETY    [provider]   propranolol (INDERAL) 20 MG tablet Take 1 tablet (20 mg total) by mouth 3 (three) times daily. 09/15/20   Joan Flores, NP  SUMAtriptan (IMITREX) 100 MG tablet sumatriptan 100 mg tablet  TAKE 1/2 1 TABLET AS NEEDED FOR MIGRAINE. MAY REPEAT IN 2 HRS AS NEEDED DO NOT EXCEED 2 IN 24 HOURSD    [provider]  venlafaxine (EFFEXOR) 100 MG tablet Take 1.5 tablets (150 mg total) by mouth daily after breakfast. 11/04/21   Joan Flores, NP      Allergies    Amoxicillin, Penicillins, Dextromethorphan, and Phenylephrine    Review of Systems   Review of Systems  Gastrointestinal:  Positive for constipation, diarrhea, nausea and vomiting.    Physical Exam Updated Vital Signs BP 110/82   Pulse 85   Temp 98.3 F (36.8 C)   Resp 19   Ht 5\' 5"  (1.651 m)   Wt 72.6 kg   LMP 11/20/2021   SpO2 100%   BMI 26.63 kg/m  Physical Exam Vitals and nursing note reviewed.  Constitutional:      Appearance: Normal appearance.  HENT:     Head: Normocephalic and atraumatic.     Mouth/Throat:     Mouth: Mucous membranes are moist.  Eyes:     General: No scleral icterus. Cardiovascular:     Rate and Rhythm: Normal rate and regular rhythm.     Pulses: Normal pulses.     Heart  sounds: Normal heart sounds.  Pulmonary:     Effort: Pulmonary effort is normal.     Breath sounds: Normal breath sounds.  Abdominal:     General: Abdomen is flat.     Palpations: Abdomen is soft.     Tenderness: There is no abdominal tenderness.  Musculoskeletal:        General: No deformity.     Comments: Mild discomfort on lower abdomen upon palpation.  Abdomen is soft.  Normal bowel sounds.  Skin:    General: Skin is warm.     Findings: No rash.  Neurological:     General: No focal deficit present.     Mental Status: She is alert.  Psychiatric:        Mood and Affect: Mood normal.    ED Results / Procedures / Treatments   Labs (all labs ordered are listed, but only abnormal results are  displayed) Labs Reviewed  COMPREHENSIVE METABOLIC PANEL - Abnormal; Notable for the following components:      Result Value   Albumin 5.1 (*)    All other components within normal limits  URINALYSIS, ROUTINE W REFLEX MICROSCOPIC - Abnormal; Notable for the following components:   APPearance HAZY (*)    Protein, ur 30 (*)    Bacteria, UA RARE (*)    All other components within normal limits  LIPASE, BLOOD  CBC  PREGNANCY, URINE    EKG None  Radiology No results found.  Procedures Procedures  {Document cardiac monitor, telemetry assessment procedure when appropriate:1}  Medications Ordered in ED Medications  sodium chloride 0.9 % bolus 1,000 mL (has no administration in time range)  ondansetron (ZOFRAN) injection 4 mg (has no administration in time range)    ED Course/ Medical Decision Making/ A&P                           Medical Decision Making Amount and/or Complexity of Data Reviewed Labs: ordered.  Risk Prescription drug management.   This patient presents to the ED for concern of ***, this involves an extensive number of treatment options, and is a complaint that carries with it a high risk of complications and morbidity.  The differential diagnosis includes *** Co morbidities that complicate the patient evaluation  See HPI Additional history obtained:  Additional history obtained from EMR External records from outside source obtained and reviewed  Lab Tests:  I Ordered, and personally interpreted labs.  The pertinent results include:   No leukocytosis noted.   No evidence of anemia.   Platelets within normal range.   No electrolyte abnormalities noted.   Renal function within normal limits.   No transaminitis noted.  UA significant for no acute abnormalities. *** Imaging Studies ordered:  I ordered imaging studies including: ***  I independently visualized and interpreted imaging. I agree with the radiologist interpretation Cardiac Monitoring: /  EKG:  The patient was maintained on a cardiac monitor.  I personally viewed and interpreted the cardiac monitored which showed an underlying rhythm of: sinus rhythm Consultations Obtained:  I requested consultation with the ***,  and discussed lab and imaging findings as well as pertinent plan - they recommend: *** Problem List / ED Course / Critical interventions / Medication management  HPI: see above Vitals signs within normal range and stable throughout visit. Laboratory/imaging studies significant for: See above On physical examination, patient is afebrile and appears in no acute distress. Patient's presentations are most concerned for ***.  Low suspicion for ***. I ordered medication including ***  Reevaluation of the patient after these medicines showed that the patient {resolved/improved/worsened:23923::"improved"} I have reviewed the patients home medicines and have made adjustments as needed Social Determinants of Health:  N/A Test / Admission / Dispo - Considered:  Continued outpatient therapy. Follow-up with PCP *** recommended for reevaluation of symptoms. Treatment plan discussed with patient.  Pt acknowledged understanding was agreeable to the plan. Worrisome signs and symptoms were discussed with patient, and patient acknowledged understanding to return to the ED if they noticed these signs and symptoms. Patient was stable upon discharge.    {Document critical care time when appropriate:1} {Document review of labs and clinical decision tools ie heart score, Chads2Vasc2 etc:1}  {Document your independent review of radiology images, and any outside records:1} {Document your discussion with family members, caretakers, and with consultants:1} {Document social determinants of health affecting pt's care:1} {Document your decision making why or why not admission, treatments were needed:1} Final Clinical Impression(s) / ED Diagnoses Final diagnoses:  None    Rx / DC  Orders ED Discharge Orders     None

## 2021-11-27 NOTE — Discharge Instructions (Signed)
Please take zofran as needed for nausea. I recommend close follow-up with your PCP for reevaluation.  Please do not hesitate to return to emergency department if worrisome signs symptoms we discussed become apparent.

## 2021-11-27 NOTE — ED Triage Notes (Signed)
Pt reports she recently started taking ozempic about 2 months ago. PT reports since then she has been experiencing intermittent nausea, diarrhea, and vomiting. Pt reports this morning, she noticed some bright red blood in her vomit. Estimated to be about 2 table spoons. Denies noticing any blood in her stool.

## 2021-12-02 ENCOUNTER — Ambulatory Visit (INDEPENDENT_AMBULATORY_CARE_PROVIDER_SITE_OTHER): Payer: 59 | Admitting: Psychiatry

## 2021-12-02 DIAGNOSIS — F3341 Major depressive disorder, recurrent, in partial remission: Secondary | ICD-10-CM

## 2021-12-02 DIAGNOSIS — Z91410 Personal history of adult physical and sexual abuse: Secondary | ICD-10-CM

## 2021-12-02 DIAGNOSIS — F1211 Cannabis abuse, in remission: Secondary | ICD-10-CM

## 2021-12-02 DIAGNOSIS — F4010 Social phobia, unspecified: Secondary | ICD-10-CM

## 2021-12-02 DIAGNOSIS — R69 Illness, unspecified: Secondary | ICD-10-CM

## 2021-12-02 DIAGNOSIS — F4310 Post-traumatic stress disorder, unspecified: Secondary | ICD-10-CM | POA: Diagnosis not present

## 2021-12-02 DIAGNOSIS — F411 Generalized anxiety disorder: Secondary | ICD-10-CM | POA: Diagnosis not present
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
## 2021-12-03 ENCOUNTER — Ambulatory Visit (HOSPITAL_BASED_OUTPATIENT_CLINIC_OR_DEPARTMENT_OTHER): Payer: 59 | Attending: Family Medicine | Admitting: Internal Medicine

## 2021-12-03 DIAGNOSIS — G4733 Obstructive sleep apnea (adult) (pediatric): Secondary | ICD-10-CM | POA: Diagnosis not present

## 2021-12-03 DIAGNOSIS — G4710 Hypersomnia, unspecified: Secondary | ICD-10-CM | POA: Diagnosis present

## 2021-12-11 DIAGNOSIS — G4710 Hypersomnia, unspecified: Secondary | ICD-10-CM | POA: Diagnosis not present

## 2021-12-11 NOTE — Procedures (Signed)
     Patient Name: Michelle Merritt, Michelle Merritt Date: 12/04/2021 Gender: Female D.O.B: 02-27-2000 Age (years): 21 Referring Provider: Mosetta Putt Height (inches): 65 Interpreting Physician: Jetty Duhamel MD, ABSM Weight (lbs): 160 RPSGT: [place] BMI: 27 MRN: [id number] Neck Size: 14.00  CLINICAL INFORMATION Sleep Study Type: HST Indication for sleep study: somnolence Epworth Sleepiness Score: 17  SLEEP STUDY TECHNIQUE A multi-channel overnight portable sleep study was performed. The channels recorded were: nasal airflow, thoracic respiratory movement, and oxygen saturation with a pulse oximetry. Snoring was also monitored.  MEDICATIONS Patient self administered medications include: none reported.  SLEEP ARCHITECTURE Patient was studied for 378.5 minutes. The sleep efficiency was 100.0 % and the patient was supine for 0%. The arousal index was 0.0 per hour.  RESPIRATORY PARAMETERS The overall AHI was 6.2 per hour, with a central apnea index of 0 per hour. The oxygen nadir was 94% during sleep.  CARDIAC DATA Mean heart rate during sleep was 87.1 bpm.  IMPRESSIONS - Mild obstructive sleep apnea occurred during this study (AHI = 6.2/h). - The patient had minimal or no oxygen desaturation during the study (Min O2 = 94%) - Patient snored.  DIAGNOSIS - Obstructive Sleep Apnea (G47.33)  RECOMMENDATIONS - Treatment for minimal OSA is guided by symptoms and co-morbidity. Conservative measures may include observation, weight loss and sleep position off back. Other options, including CPAP, a fitted oral appliance or ENT evaluation, would be based on clinical judgment. - If Narcolepsy is a concern, the formal documentation is based on an -in-center overnight NPSG, followed next  day by a Multiple Sleep Latency test. These are done off stimulants, sedatives, antidepressants and other medication that might significantly alter sleep architecture. The [facility] can  provide this information.. - Be careful with alcohol, sedatives and other CNS depressants that may worsen sleep apnea and disrupt normal sleep architecture. - Sleep hygiene should be reviewed to assess factors that may improve sleep quality. - Weight management and regular exercise should be initiated or continued.  [Electronically signed] 12/11/2021 12:11 PM  Jetty Duhamel MD, ABSM Diplomate, American Board of Sleep Medicine NPI: [id number]                        Jetty Duhamel Diplomate, American Board of Sleep Medicine  ELECTRONICALLY SIGNED ON:  12/11/2021, 12:01 PM [facility] PH: [phone]   FX: [phone] ACCREDITED BY THE AMERICAN ACADEMY OF SLEEP MEDICINE

## 2021-12-15 LAB — LITHIUM LEVEL: Lithium Lvl: 0.3 mmol/L — ABNORMAL LOW (ref 0.6–1.2)

## 2021-12-17 ENCOUNTER — Ambulatory Visit: Payer: 59 | Admitting: Psychiatry

## 2022-01-13 ENCOUNTER — Ambulatory Visit: Payer: 59 | Admitting: Psychiatry

## 2022-01-27 ENCOUNTER — Ambulatory Visit: Payer: 59 | Admitting: Psychiatry

## 2022-02-08 ENCOUNTER — Ambulatory Visit (INDEPENDENT_AMBULATORY_CARE_PROVIDER_SITE_OTHER): Payer: 59 | Admitting: Behavioral Health

## 2022-02-08 ENCOUNTER — Encounter: Payer: Self-pay | Admitting: Behavioral Health

## 2022-02-08 DIAGNOSIS — R45851 Suicidal ideations: Secondary | ICD-10-CM | POA: Diagnosis not present

## 2022-02-08 DIAGNOSIS — F331 Major depressive disorder, recurrent, moderate: Secondary | ICD-10-CM | POA: Diagnosis not present

## 2022-02-08 DIAGNOSIS — F411 Generalized anxiety disorder: Secondary | ICD-10-CM

## 2022-02-08 DIAGNOSIS — F429 Obsessive-compulsive disorder, unspecified: Secondary | ICD-10-CM

## 2022-02-08 MED ORDER — LITHIUM CARBONATE 300 MG PO TABS: 300.0000 mg | ORAL_TABLET | Freq: Two times a day (BID) | ORAL | 3 refills | Status: DC

## 2022-02-08 NOTE — Progress Notes (Signed)
[facility]  Patient ID: Michelle Merritt,  MRN: [id number]  PCP: Mosetta Putt, MD  Date of Evaluation: 02/08/2022 Time spent:30 minutes  Chief Complaint:  Chief Complaint   Depression; Anxiety; Follow-up; Medication Refill; Patient Education; Stress     HISTORY/CURRENT STATUS: HPI  22 year old female presents for follow up and medication management.  Pt report increase in anxiety and has had to utilize her propranolol more frequently. She wants to wean off Effexor. Says she does not feel it is helping and she would like to try just getting off of it. Does not want to look at replacement AD right now but will monitor closely.  This visit pt is calm and appears to be a little more happy and content with depression. Happy with her relationship with boyfriend  right now. She has had no SI in several months and is happy about how lithium has helped. Says her anxiety today is 4/10 and depression 2/10.  She is sleeping 7 plus hours at night. No current SI/ No HI. No mania, no psychosis. Agrees to f/u labs with [facility] to obtain Lithium levels every three months. No concerns with last Lithium levels.   Past psychiatric medication trials Sertraline Fluoxetine Citalopram  Xanax       Individual Medical History/ Review of Systems: Changes? :No   Allergies: Amoxicillin, Penicillins, Dextromethorphan, and Phenylephrine  Current Medications:  Current Outpatient Medications:    Levonorgestrel (KYLEENA) 19.5 MG IUD, Kyleena 17.5 mcg/24 hrs (41yrs) 19.5mg  intrauterine device  Take 1 device by intrauterine route., Disp: , Rfl:    lithium 300 MG tablet, Take 1 tablet (300 mg total) by mouth 2 (two) times daily., Disp: 60 tablet, Rfl: 3   LORazepam (ATIVAN) 0.5 MG tablet, Take 1 tablet (0.5 mg total) by mouth every 8 (eight) hours., Disp: 15 tablet, Rfl: 1   NORGESTIMATE-ETH ESTRADIOL PO, Take by mouth., Disp: , Rfl:    ondansetron (ZOFRAN) 4 MG tablet, Take 1 tablet (4 mg  total) by mouth every 6 (six) hours., Disp: 12 tablet, Rfl: 0   PROAIR RESPICLICK 108 (90 Base) MCG/ACT AEPB, Inhale 2 puffs into the lungs every 4 (four) hours as needed., Disp: , Rfl:    propranolol (INDERAL) 20 MG tablet, propranolol 20 mg tablet  TAKE 1 TO 2 TABLETS BY MOUTH EVERY 6 HOURS AS NEEDED FOR ANXIETY, Disp: , Rfl:    propranolol (INDERAL) 20 MG tablet, Take 1 tablet (20 mg total) by mouth 3 (three) times daily., Disp: 90 tablet, Rfl: 2   SUMAtriptan (IMITREX) 100 MG tablet, sumatriptan 100 mg tablet  TAKE 1/2 1 TABLET AS NEEDED FOR MIGRAINE. MAY REPEAT IN 2 HRS AS NEEDED DO NOT EXCEED 2 IN 24 HOURSD, Disp: , Rfl:    venlafaxine (EFFEXOR) 100 MG tablet, Take 1.5 tablets (150 mg total) by mouth daily after breakfast., Disp: 135 tablet, Rfl: 1 Medication Side Effects: none  Family Medical/ Social History: Changes? No  MENTAL HEALTH EXAM:  There were no vitals taken for this visit.There is no height or weight on file to calculate BMI.  General Appearance: Casual, Neat, and Well Groomed  Eye Contact:  Good  Speech:  Clear and Coherent  Volume:  Normal  Mood:  Anxious  Affect:  Appropriate  Thought Process:  Coherent  Orientation:  Full (Time, Place, and Person)  Thought Content: Logical   Suicidal Thoughts:  No  Homicidal Thoughts:  No  Memory:  WNL  Judgement:  Good  Insight:  Good  Psychomotor Activity:  Normal  Concentration:  Concentration: Good  Recall:  Good  Fund of Knowledge: Good  Language: Good  Assets:  Desire for Improvement  ADL's:  Intact  Cognition: WNL  Prognosis:  Good    DIAGNOSES:    ICD-10-CM   1. Major depressive disorder, recurrent episode, moderate (HCC)  F33.1 lithium 300 MG tablet    2. Generalized anxiety disorder  F41.1 lithium 300 MG tablet    3. Suicidal ideation  R45.851 lithium 300 MG tablet    4. Obsessive-compulsive disorder, unspecified type  F42.9 lithium 300 MG tablet      Receiving Psychotherapy: No    RECOMMENDATIONS:   To start #15 Ativan 0.5 mg for severe anxiety or panic. Patient understands to use sparingly.  Continue Lithium 300 mg twice daily (600 mg total). To reduce Venlafaxine 150 mg daily by 50 mg each week until finished.  Reviewed last lithium labs. 0.3. A little low but she is reporting doing well on current dose. Pt stopped Ozempic which PCP thinks may have reduced her absorption of Lithium due to vomiting.  Continue Inderal 20 mg twice daily as needed. Will report worsening symptoms promptly. Follow up in 3 months to reassess. Reinforced after hours emergency number and suicide hotline.  Greater than 50% of 30 min face to face time with patient was spent on counseling and coordination of care. Discussed her continued increase of anxiety. She says it may be school related. Doe not want to continue Effexor and would like to wean off. I advised her that Effexor is sometime difficult but that we need to do over the course of a month preferably. Does not want to change meds or replace AD right now. Discussed efficacy with her medications.  Still struggles with OCD aspect.  No SI since last visit. Discussed potential benefits, risk, and side effects of benzodiazepines to include potential risk of tolerance and dependence, as well as possible drowsiness.  Advised patient not to drive if experiencing drowsiness and to take lowest possible effective dose to minimize risk of dependence and tolerance.  She will continue in therapy. She verbally contracted for safety. Reviewed weight gain. She would like to discuss new med with PCP.  Reviewed PDMP        Elwanda Brooklyn, NP

## 2022-02-17 ENCOUNTER — Ambulatory Visit (INDEPENDENT_AMBULATORY_CARE_PROVIDER_SITE_OTHER): Payer: 59 | Admitting: Psychiatry

## 2022-02-17 DIAGNOSIS — F1011 Alcohol abuse, in remission: Secondary | ICD-10-CM

## 2022-02-17 DIAGNOSIS — F4310 Post-traumatic stress disorder, unspecified: Secondary | ICD-10-CM | POA: Diagnosis not present

## 2022-02-17 DIAGNOSIS — F1211 Cannabis abuse, in remission: Secondary | ICD-10-CM

## 2022-02-17 DIAGNOSIS — F17291 Nicotine dependence, other tobacco product, in remission: Secondary | ICD-10-CM

## 2022-02-17 DIAGNOSIS — F4010 Social phobia, unspecified: Secondary | ICD-10-CM | POA: Diagnosis not present

## 2022-02-17 DIAGNOSIS — F3341 Major depressive disorder, recurrent, in partial remission: Secondary | ICD-10-CM | POA: Diagnosis not present
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
## 2022-03-17 ENCOUNTER — Ambulatory Visit (INDEPENDENT_AMBULATORY_CARE_PROVIDER_SITE_OTHER): Payer: 59 | Admitting: Psychiatry

## 2022-03-17 DIAGNOSIS — F4310 Post-traumatic stress disorder, unspecified: Secondary | ICD-10-CM

## 2022-03-17 DIAGNOSIS — F411 Generalized anxiety disorder: Secondary | ICD-10-CM

## 2022-03-17 DIAGNOSIS — F3281 Premenstrual dysphoric disorder: Secondary | ICD-10-CM

## 2022-03-17 DIAGNOSIS — F3341 Major depressive disorder, recurrent, in partial remission: Secondary | ICD-10-CM | POA: Diagnosis not present

## 2022-03-17 DIAGNOSIS — F17291 Nicotine dependence, other tobacco product, in remission: Secondary | ICD-10-CM

## 2022-03-17 DIAGNOSIS — F1011 Alcohol abuse, in remission: Secondary | ICD-10-CM

## 2022-03-17 DIAGNOSIS — F1211 Cannabis abuse, in remission: Secondary | ICD-10-CM
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
## 2022-04-07 ENCOUNTER — Ambulatory Visit: Payer: 59 | Admitting: Psychiatry

## 2022-04-08 ENCOUNTER — Encounter: Payer: Self-pay | Admitting: Behavioral Health

## 2022-04-08 ENCOUNTER — Ambulatory Visit (INDEPENDENT_AMBULATORY_CARE_PROVIDER_SITE_OTHER): Payer: 59 | Admitting: Behavioral Health

## 2022-04-08 DIAGNOSIS — Z79899 Other long term (current) drug therapy: Secondary | ICD-10-CM

## 2022-04-08 NOTE — Progress Notes (Signed)
[facility]  Patient ID: Michelle Merritt,  MRN: [id number]  PCP: Derinda Late, MD  Date of Evaluation: 04/08/2022 Time spent:30 minutes  Chief Complaint:  Chief Complaint   Anxiety; Depression; Follow-up; Medication Refill; Patient Education; Medication Problem     HISTORY/CURRENT STATUS: HPI  22 year old female presents for follow up and medication management.  This visit pt is calm and appears to be a little more happy and content. Says she is considering relationship changes and possibly ending with boyfriend. She stopped Effexor but is feeling happy and focused on getting into law school. She has had no SI in several months and is happy about how lithium has helped. Says her anxiety today is 2/10 and depression 2/10.  She is sleeping 7 plus hours at night. No current SI/ No HI. No mania, no psychosis. Agrees to f/u labs with [facility] to obtain Lithium levels every three months. No concerns with last Lithium levels.   Past psychiatric medication trials Sertraline Fluoxetine Citalopram  Xanax    Individual Medical History/ Review of Systems: Changes? :No   Allergies: Amoxicillin, Penicillins, Dextromethorphan, and Phenylephrine  Current Medications:  Current Outpatient Medications:    Levonorgestrel (KYLEENA) 19.5 MG IUD, Kyleena 17.5 mcg/24 hrs (24yr) 19.'5mg'$  intrauterine device  Take 1 device by intrauterine route., Disp: , Rfl:    lithium 300 MG tablet, Take 1 tablet (300 mg total) by mouth 2 (two) times daily., Disp: 60 tablet, Rfl: 3   LORazepam (ATIVAN) 0.5 MG tablet, Take 1 tablet (0.5 mg total) by mouth every 8 (eight) hours., Disp: 15 tablet, Rfl: 1   NORGESTIMATE-ETH ESTRADIOL PO, Take by mouth., Disp: , Rfl:    ondansetron (ZOFRAN) 4 MG tablet, Take 1 tablet (4 mg total) by mouth every 6 (six) hours., Disp: 12 tablet, Rfl: 0   PROAIR RESPICLICK [id number](90 Base) MCG/ACT AEPB, Inhale 2 puffs into the lungs every 4 (four) hours as needed., Disp: ,  Rfl:    propranolol (INDERAL) 20 MG tablet, propranolol 20 mg tablet  TAKE 1 TO 2 TABLETS BY MOUTH EVERY 6 HOURS AS NEEDED FOR ANXIETY, Disp: , Rfl:    propranolol (INDERAL) 20 MG tablet, Take 1 tablet (20 mg total) by mouth 3 (three) times daily., Disp: 90 tablet, Rfl: 2   SUMAtriptan (IMITREX) 100 MG tablet, sumatriptan 100 mg tablet  TAKE 1/2 1 TABLET AS NEEDED FOR MIGRAINE. MAY REPEAT IN 2 HRS AS NEEDED DO NOT EXCEED 2 IN 24 HOURSD, Disp: , Rfl:    venlafaxine (EFFEXOR) 100 MG tablet, Take 1.5 tablets (150 mg total) by mouth daily after breakfast., Disp: 135 tablet, Rfl: 1 Medication Side Effects: none  Family Medical/ Social History: Changes? No  MENTAL HEALTH EXAM:  There were no vitals taken for this visit.There is no height or weight on file to calculate BMI.  General Appearance: Casual, Neat, and Well Groomed  Eye Contact:  Good  Speech:  Clear and Coherent  Volume:  Normal  Mood:  Anxious, Depressed, and Dysphoric  Affect:  Appropriate  Thought Process:  Coherent  Orientation:  Full (Time, Place, and Person)  Thought Content: Logical   Suicidal Thoughts:  No  Homicidal Thoughts:  No  Memory:  WNL  Judgement:  Good  Insight:  Good  Psychomotor Activity:  Normal  Concentration:  Concentration: Good  Recall:  Good  Fund of Knowledge: Good  Language: Good  Assets:  Desire for Improvement  ADL's:  Intact  Cognition: WNL  Prognosis:  Good  DIAGNOSES:    ICD-10-CM   1. High risk medication use  Z79.899 Lithium level      Receiving Psychotherapy: No    RECOMMENDATIONS:   Continue  #15 Ativan 0.5 mg for severe anxiety or panic. Patient understands to use sparingly.  Pt stopped Venlafaxine 150 mg daily by 50 mg each week until finished.  Reviewed last lithium labs. Ordered new Lithium labs.  Continue Inderal 20 mg twice daily as needed. Will report worsening symptoms promptly. Follow up in 3 months to reassess. Reinforced after hours emergency number and  suicide hotline.  Greater than 50% of 30 min face to face time with patient was spent on counseling and coordination of care.  We discussed her weaning of Effexor. Says she feels great so far. Discussed efficacy with her medications.  Still struggles with OCD aspect.  No SI since last visit. Discussed potential benefits, risk, and side effects of benzodiazepines to include potential risk of tolerance and dependence, as well as possible drowsiness.  Advised patient not to drive if experiencing drowsiness and to take lowest possible effective dose to minimize risk of dependence and tolerance.  She will continue in therapy. She verbally contracted for safety. Reviewed weight gain. She would like to discuss new med with PCP.  Reviewed PDMP     Elwanda Brooklyn, NP

## 2022-04-20 LAB — LITHIUM LEVEL: Lithium Lvl: 0.5 mmol/L — ABNORMAL LOW (ref 0.6–1.2)

## 2022-04-25 ENCOUNTER — Ambulatory Visit (INDEPENDENT_AMBULATORY_CARE_PROVIDER_SITE_OTHER): Payer: 59 | Admitting: Psychiatry

## 2022-04-25 DIAGNOSIS — F4310 Post-traumatic stress disorder, unspecified: Secondary | ICD-10-CM | POA: Diagnosis not present

## 2022-04-25 DIAGNOSIS — F4010 Social phobia, unspecified: Secondary | ICD-10-CM

## 2022-04-25 DIAGNOSIS — F411 Generalized anxiety disorder: Secondary | ICD-10-CM | POA: Diagnosis not present

## 2022-04-25 DIAGNOSIS — F1211 Cannabis abuse, in remission: Secondary | ICD-10-CM

## 2022-04-25 DIAGNOSIS — F3281 Premenstrual dysphoric disorder: Secondary | ICD-10-CM

## 2022-04-25 DIAGNOSIS — F3341 Major depressive disorder, recurrent, in partial remission: Secondary | ICD-10-CM
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
## 2022-05-06 ENCOUNTER — Other Ambulatory Visit: Payer: Self-pay | Admitting: Behavioral Health

## 2022-05-06 ENCOUNTER — Telehealth: Payer: Self-pay | Admitting: Behavioral Health

## 2022-05-06 DIAGNOSIS — F4001 Agoraphobia with panic disorder: Secondary | ICD-10-CM

## 2022-05-06 DIAGNOSIS — F411 Generalized anxiety disorder: Secondary | ICD-10-CM

## 2022-05-06 MED ORDER — LORAZEPAM 0.5 MG PO TABS: ORAL_TABLET | ORAL | 1 refills | Status: DC

## 2022-05-06 NOTE — Telephone Encounter (Signed)
Pt LVM @ 2:08p.  She would like refill of Lorazepam sent to [facility] [address] in [place].  Next appt 6/7

## 2022-05-09 ENCOUNTER — Ambulatory Visit: Payer: 59 | Admitting: Psychiatry

## 2022-05-11 ENCOUNTER — Ambulatory Visit (INDEPENDENT_AMBULATORY_CARE_PROVIDER_SITE_OTHER): Payer: 59 | Admitting: Behavioral Health

## 2022-05-11 ENCOUNTER — Encounter: Payer: Self-pay | Admitting: Behavioral Health

## 2022-05-11 VITALS — Wt 172.0 lb

## 2022-05-11 DIAGNOSIS — Z79899 Other long term (current) drug therapy: Secondary | ICD-10-CM

## 2022-05-11 DIAGNOSIS — F410 Panic disorder [episodic paroxysmal anxiety] without agoraphobia: Secondary | ICD-10-CM | POA: Diagnosis not present

## 2022-05-11 DIAGNOSIS — F331 Major depressive disorder, recurrent, moderate: Secondary | ICD-10-CM | POA: Diagnosis not present

## 2022-05-11 DIAGNOSIS — F411 Generalized anxiety disorder: Secondary | ICD-10-CM

## 2022-05-11 MED ORDER — VILAZODONE HCL 20 MG PO TABS: 20.0000 mg | ORAL_TABLET | Freq: Every day | ORAL | 1 refills | Status: DC

## 2022-05-11 MED ORDER — LORAZEPAM 1 MG PO TABS: ORAL_TABLET | ORAL | 0 refills | Status: DC
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
## 2022-05-13 ENCOUNTER — Encounter: Payer: Self-pay | Admitting: Behavioral Health

## 2022-05-23 ENCOUNTER — Ambulatory Visit (INDEPENDENT_AMBULATORY_CARE_PROVIDER_SITE_OTHER): Payer: 59 | Admitting: Psychiatry

## 2022-05-23 DIAGNOSIS — F1011 Alcohol abuse, in remission: Secondary | ICD-10-CM

## 2022-05-23 DIAGNOSIS — F4310 Post-traumatic stress disorder, unspecified: Secondary | ICD-10-CM

## 2022-05-23 DIAGNOSIS — F411 Generalized anxiety disorder: Secondary | ICD-10-CM

## 2022-05-23 DIAGNOSIS — F3341 Major depressive disorder, recurrent, in partial remission: Secondary | ICD-10-CM

## 2022-05-23 DIAGNOSIS — F1211 Cannabis abuse, in remission: Secondary | ICD-10-CM

## 2022-05-23 DIAGNOSIS — F17291 Nicotine dependence, other tobacco product, in remission: Secondary | ICD-10-CM

## 2022-05-23 DIAGNOSIS — F4010 Social phobia, unspecified: Secondary | ICD-10-CM
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
## 2022-06-06 ENCOUNTER — Ambulatory Visit: Payer: 59 | Admitting: Behavioral Health

## 2022-06-10 ENCOUNTER — Ambulatory Visit (INDEPENDENT_AMBULATORY_CARE_PROVIDER_SITE_OTHER): Payer: 59 | Admitting: Behavioral Health

## 2022-06-10 ENCOUNTER — Encounter: Payer: Self-pay | Admitting: Behavioral Health

## 2022-06-10 DIAGNOSIS — F411 Generalized anxiety disorder: Secondary | ICD-10-CM | POA: Diagnosis not present

## 2022-06-10 DIAGNOSIS — F410 Panic disorder [episodic paroxysmal anxiety] without agoraphobia: Secondary | ICD-10-CM

## 2022-06-10 DIAGNOSIS — F3341 Major depressive disorder, recurrent, in partial remission: Secondary | ICD-10-CM

## 2022-06-10 NOTE — Progress Notes (Signed)
[facility]  Patient ID: Michelle Merritt,  MRN: [id number]  PCP: Mosetta Putt, MD  Date of Evaluation: 06/10/2022 Time spent:30 minutes  Chief Complaint:  Chief Complaint   Anxiety; Depression; Follow-up; Patient Education     HISTORY/CURRENT STATUS: HPI 22 year old female presents for follow up and medication management.  Anxiety has improved since last visit. She feels like Viibryd is helping but she is still not where she would like to be. She would like to wait a couple more weeks before adjusting.  Panic attacks have subsided recently.  She has had no SI in several months and is happy about how lithium has helped. Says her anxiety today is 4/10 and depression 2/10.  She is sleeping 7 plus hours at night. No current SI/ No HI. No mania, no psychosis. Agrees to f/u labs with [facility] to obtain Lithium levels every three months. No concerns with last Lithium levels.   Past psychiatric medication trials Sertraline Fluoxetine Citalopram  Xanax       Individual Medical History/ Review of Systems: Changes? :No   Allergies: Amoxicillin, Penicillins, Dextromethorphan, and Phenylephrine  Current Medications:  Current Outpatient Medications:    Levonorgestrel (KYLEENA) 19.5 MG IUD, Kyleena 17.5 mcg/24 hrs (80yrs) 19.5mg  intrauterine device  Take 1 device by intrauterine route., Disp: , Rfl:    lithium 300 MG tablet, Take 1 tablet (300 mg total) by mouth 2 (two) times daily., Disp: 60 tablet, Rfl: 3   LORazepam (ATIVAN) 0.5 MG tablet, Take one tablet by mouth daily as needed only for severe anxiety or panic, Disp: 20 tablet, Rfl: 1   LORazepam (ATIVAN) 1 MG tablet, Take one tablet by mouth daily only for severe anxiety or panic attacks., Disp: 20 tablet, Rfl: 0   NORGESTIMATE-ETH ESTRADIOL PO, Take by mouth., Disp: , Rfl:    ondansetron (ZOFRAN) 4 MG tablet, Take 1 tablet (4 mg total) by mouth every 6 (six) hours., Disp: 12 tablet, Rfl: 0   PROAIR RESPICLICK  108 (90 Base) MCG/ACT AEPB, Inhale 2 puffs into the lungs every 4 (four) hours as needed., Disp: , Rfl:    propranolol (INDERAL) 20 MG tablet, propranolol 20 mg tablet  TAKE 1 TO 2 TABLETS BY MOUTH EVERY 6 HOURS AS NEEDED FOR ANXIETY, Disp: , Rfl:    propranolol (INDERAL) 20 MG tablet, Take 1 tablet (20 mg total) by mouth 3 (three) times daily., Disp: 90 tablet, Rfl: 2   SUMAtriptan (IMITREX) 100 MG tablet, sumatriptan 100 mg tablet  TAKE 1/2 1 TABLET AS NEEDED FOR MIGRAINE. MAY REPEAT IN 2 HRS AS NEEDED DO NOT EXCEED 2 IN 24 HOURSD, Disp: , Rfl:    venlafaxine (EFFEXOR) 100 MG tablet, Take 1.5 tablets (150 mg total) by mouth daily after breakfast., Disp: 135 tablet, Rfl: 1   Vilazodone HCl 20 MG TABS, Take 1 tablet (20 mg total) by mouth daily after breakfast., Disp: 30 tablet, Rfl: 1 Medication Side Effects: none  Family Medical/ Social History: Changes? No  MENTAL HEALTH EXAM:  There were no vitals taken for this visit.There is no height or weight on file to calculate BMI.  General Appearance: Casual, Neat, and Well Groomed  Eye Contact:  Good  Speech:  Clear and Coherent  Volume:  Normal  Mood:  Anxious  Affect:  Appropriate  Thought Process:  Coherent  Orientation:  Full (Time, Place, and Person)  Thought Content: Logical   Suicidal Thoughts:  No  Homicidal Thoughts:  No  Memory:  WNL  Judgement:  Good  Insight:  Good  Psychomotor Activity:  Normal  Concentration:  Concentration: Good  Recall:  Good  Fund of Knowledge: Good  Language: Good  Assets:  Desire for Improvement  ADL's:  Intact  Cognition: WNL  Prognosis:  Good    DIAGNOSES: No diagnosis found.  Receiving Psychotherapy: No    RECOMMENDATIONS:   Continue Ativan 1 mg only for severe anxiety or panic. Patient understands to use sparingly.  Pt has been off Effexor for about 2 months. OCD symptoms with severe anxiety have returned.  Continue Viibryd 20 mg daily.  Must take with food.  Continue Inderal 20 mg  twice daily as needed. Reviewed Lithium levels from last labs Will report worsening symptoms  or side effects promptly. Follow up in 4 weeks to reassess. Reinforced after hours emergency number and suicide hotline.   Greater than 50% of 30 min face to face time with patient was spent on counseling and coordination of care.  We talked about her modest improvement with Viibryd so far. She would like to continue. She agreed to journal her anxiety over the next few weeks and we will reassess medication again.  Discussed potential benefits, risk, and side effects of benzodiazepines to include potential risk of tolerance and dependence, as well as possible drowsiness.  Advised patient not to drive if experiencing drowsiness and to take lowest possible effective dose to minimize risk of dependence and tolerance.  She will continue in therapy. She verbally contracted for safety. Reviewed weight gain. She would like to discuss new med with PCP.  Reviewed PDMP           Joan Flores, NP

## 2022-06-13 ENCOUNTER — Other Ambulatory Visit: Payer: Self-pay | Admitting: Family Medicine

## 2022-06-13 DIAGNOSIS — R1084 Generalized abdominal pain: Secondary | ICD-10-CM

## 2022-06-14 ENCOUNTER — Ambulatory Visit
Admission: RE | Admit: 2022-06-14 | Discharge: 2022-06-14 | Disposition: A | Discharge status: Home / Self Care | Payer: 59 | Source: Ambulatory Visit | Attending: Family Medicine | Admitting: Family Medicine

## 2022-06-14 DIAGNOSIS — R1084 Generalized abdominal pain: Secondary | ICD-10-CM

## 2022-06-14 MED ORDER — IOPAMIDOL (ISOVUE-300) INJECTION 61%
100.0000 mL | Freq: Once | INTRAVENOUS | Status: AC | PRN
  Administered 2022-06-14: 100 mL via INTRAVENOUS

## 2022-06-22 ENCOUNTER — Telehealth: Payer: Self-pay | Admitting: Behavioral Health

## 2022-06-22 ENCOUNTER — Ambulatory Visit (INDEPENDENT_AMBULATORY_CARE_PROVIDER_SITE_OTHER): Payer: 59 | Admitting: Psychiatry

## 2022-06-22 DIAGNOSIS — F3341 Major depressive disorder, recurrent, in partial remission: Secondary | ICD-10-CM

## 2022-06-22 DIAGNOSIS — F4310 Post-traumatic stress disorder, unspecified: Secondary | ICD-10-CM | POA: Diagnosis not present

## 2022-06-22 DIAGNOSIS — F4010 Social phobia, unspecified: Secondary | ICD-10-CM

## 2022-06-22 DIAGNOSIS — F1211 Cannabis abuse, in remission: Secondary | ICD-10-CM | POA: Diagnosis not present

## 2022-06-22 DIAGNOSIS — F1011 Alcohol abuse, in remission: Secondary | ICD-10-CM
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
## 2022-06-22 NOTE — Telephone Encounter (Signed)
Pt Lvm @ 4:42p.  She said she started taking Viibryd. Arlys John told her to call back in 2 wks and let her know how it's going.  She said she feels better, but not 100%.  She wants to know if she can increase the dosage as she and Arlys John discussed at her appt.  Next appt 7/11

## 2022-06-23 ENCOUNTER — Other Ambulatory Visit: Payer: Self-pay | Admitting: Behavioral Health

## 2022-06-23 DIAGNOSIS — F410 Panic disorder [episodic paroxysmal anxiety] without agoraphobia: Secondary | ICD-10-CM

## 2022-06-23 DIAGNOSIS — F411 Generalized anxiety disorder: Secondary | ICD-10-CM

## 2022-06-23 DIAGNOSIS — F3341 Major depressive disorder, recurrent, in partial remission: Secondary | ICD-10-CM

## 2022-06-23 MED ORDER — VILAZODONE HCL 40 MG PO TABS: 40.0000 mg | ORAL_TABLET | Freq: Every day | ORAL | 2 refills | Status: DC

## 2022-06-23 NOTE — Telephone Encounter (Signed)
I called her directly and she will be increasing Viibryd to 40 mg daily.

## 2022-06-23 NOTE — Telephone Encounter (Signed)
See message from patient. She is taking with food. She is reporting "crazy dreams and nightmares," no other SE.

## 2022-07-04 ENCOUNTER — Telehealth: Payer: Self-pay | Admitting: Behavioral Health

## 2022-07-04 DIAGNOSIS — F411 Generalized anxiety disorder: Secondary | ICD-10-CM

## 2022-07-04 DIAGNOSIS — F331 Major depressive disorder, recurrent, moderate: Secondary | ICD-10-CM

## 2022-07-04 DIAGNOSIS — F429 Obsessive-compulsive disorder, unspecified: Secondary | ICD-10-CM

## 2022-07-04 DIAGNOSIS — R45851 Suicidal ideations: Secondary | ICD-10-CM

## 2022-07-04 MED ORDER — LITHIUM CARBONATE 300 MG PO TABS: 300.0000 mg | ORAL_TABLET | Freq: Two times a day (BID) | ORAL | 1 refills | Status: DC

## 2022-07-04 NOTE — Telephone Encounter (Signed)
Pt requested new Rx for Lithium 300 mg  2/d #60 to [facility]  [address]. Out in 2 days. Apt 7/11

## 2022-07-04 NOTE — Telephone Encounter (Signed)
Sent!

## 2022-07-07 ENCOUNTER — Ambulatory Visit (INDEPENDENT_AMBULATORY_CARE_PROVIDER_SITE_OTHER): Payer: 59 | Admitting: Psychiatry

## 2022-07-07 DIAGNOSIS — F3341 Major depressive disorder, recurrent, in partial remission: Secondary | ICD-10-CM

## 2022-07-07 DIAGNOSIS — F1011 Alcohol abuse, in remission: Secondary | ICD-10-CM | POA: Diagnosis not present

## 2022-07-07 DIAGNOSIS — F1211 Cannabis abuse, in remission: Secondary | ICD-10-CM

## 2022-07-07 DIAGNOSIS — F4310 Post-traumatic stress disorder, unspecified: Secondary | ICD-10-CM

## 2022-07-07 DIAGNOSIS — F4010 Social phobia, unspecified: Secondary | ICD-10-CM
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
## 2022-07-08 ENCOUNTER — Ambulatory Visit: Payer: 59 | Admitting: Behavioral Health

## 2022-07-08 ENCOUNTER — Other Ambulatory Visit: Payer: Self-pay | Admitting: Behavioral Health

## 2022-07-08 DIAGNOSIS — F411 Generalized anxiety disorder: Secondary | ICD-10-CM

## 2022-07-08 DIAGNOSIS — F331 Major depressive disorder, recurrent, moderate: Secondary | ICD-10-CM

## 2022-07-20 ENCOUNTER — Ambulatory Visit (INDEPENDENT_AMBULATORY_CARE_PROVIDER_SITE_OTHER): Payer: 59 | Admitting: Psychiatry

## 2022-07-20 DIAGNOSIS — F1211 Cannabis abuse, in remission: Secondary | ICD-10-CM

## 2022-07-20 DIAGNOSIS — F4310 Post-traumatic stress disorder, unspecified: Secondary | ICD-10-CM | POA: Diagnosis not present

## 2022-07-20 DIAGNOSIS — F4010 Social phobia, unspecified: Secondary | ICD-10-CM | POA: Diagnosis not present

## 2022-07-20 DIAGNOSIS — F1011 Alcohol abuse, in remission: Secondary | ICD-10-CM | POA: Diagnosis not present

## 2022-07-20 DIAGNOSIS — F3341 Major depressive disorder, recurrent, in partial remission: Secondary | ICD-10-CM | POA: Diagnosis not present
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
## 2022-08-01 ENCOUNTER — Ambulatory Visit: Payer: 59 | Admitting: Psychiatry

## 2022-08-11 ENCOUNTER — Ambulatory Visit: Payer: 59 | Admitting: Behavioral Health

## 2022-08-15 ENCOUNTER — Ambulatory Visit (INDEPENDENT_AMBULATORY_CARE_PROVIDER_SITE_OTHER): Payer: 59 | Admitting: Psychiatry

## 2022-08-15 DIAGNOSIS — F4010 Social phobia, unspecified: Secondary | ICD-10-CM

## 2022-08-15 DIAGNOSIS — F1011 Alcohol abuse, in remission: Secondary | ICD-10-CM

## 2022-08-15 DIAGNOSIS — Z654 Victim of crime and terrorism: Secondary | ICD-10-CM | POA: Diagnosis not present

## 2022-08-15 DIAGNOSIS — F3341 Major depressive disorder, recurrent, in partial remission: Secondary | ICD-10-CM | POA: Diagnosis not present

## 2022-08-15 DIAGNOSIS — F4310 Post-traumatic stress disorder, unspecified: Secondary | ICD-10-CM

## 2022-08-15 DIAGNOSIS — F1211 Cannabis abuse, in remission: Secondary | ICD-10-CM
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
## 2022-08-16 ENCOUNTER — Ambulatory Visit (INDEPENDENT_AMBULATORY_CARE_PROVIDER_SITE_OTHER): Payer: 59 | Admitting: Behavioral Health

## 2022-08-16 ENCOUNTER — Encounter: Payer: Self-pay | Admitting: Behavioral Health

## 2022-08-16 DIAGNOSIS — Z79899 Other long term (current) drug therapy: Secondary | ICD-10-CM

## 2022-08-16 DIAGNOSIS — R45851 Suicidal ideations: Secondary | ICD-10-CM | POA: Diagnosis not present

## 2022-08-16 DIAGNOSIS — F410 Panic disorder [episodic paroxysmal anxiety] without agoraphobia: Secondary | ICD-10-CM

## 2022-08-16 DIAGNOSIS — F331 Major depressive disorder, recurrent, moderate: Secondary | ICD-10-CM | POA: Diagnosis not present

## 2022-08-16 DIAGNOSIS — F411 Generalized anxiety disorder: Secondary | ICD-10-CM | POA: Diagnosis not present

## 2022-08-16 DIAGNOSIS — F429 Obsessive-compulsive disorder, unspecified: Secondary | ICD-10-CM

## 2022-08-16 MED ORDER — LORAZEPAM 1 MG PO TABS: ORAL_TABLET | ORAL | 0 refills | Status: AC

## 2022-08-16 MED ORDER — LITHIUM CARBONATE 300 MG PO TABS: 300.0000 mg | ORAL_TABLET | Freq: Two times a day (BID) | ORAL | 1 refills | Status: DC

## 2022-08-16 MED ORDER — VENLAFAXINE HCL ER 75 MG PO CP24: 75.0000 mg | ORAL_CAPSULE | Freq: Every day | ORAL | 0 refills | Status: DC

## 2022-08-16 MED ORDER — VENLAFAXINE HCL ER 150 MG PO CP24: 150.0000 mg | ORAL_CAPSULE | Freq: Every day | ORAL | 1 refills | Status: DC

## 2022-08-16 MED ORDER — PROPRANOLOL HCL 20 MG PO TABS: 20.0000 mg | ORAL_TABLET | Freq: Three times a day (TID) | ORAL | 2 refills | Status: AC

## 2022-08-16 NOTE — Progress Notes (Signed)
[facility]  Patient ID: Michelle Merritt,  MRN: [id number]  PCP: Mosetta Putt, MD  Date of Evaluation: 08/16/2022 Time spent:30 minutes  Chief Complaint:  Chief Complaint   Anxiety; Depression; Follow-up; Medication Refill     HISTORY/CURRENT STATUS: HPI  22 year old female presents for follow up and medication management.  Anxiety has improved since last visit. Says she started to wean down on Viibryd due to non stop night mares or vivid dreams. She is requesting to go back on Effexor.   Panic attacks have subsided recently.  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 She has had no SI in several months and is happy about how lithium has helped. Says her anxiety today is 2/10 and depression 2/10.  She is sleeping 7 plus hours at night. No current SI/ No HI. No mania, no psychosis. Agrees to f/u labs with [facility] to obtain Lithium levels every three months. No concerns with last Lithium levels.   Past psychiatric medication trials Sertraline Fluoxetine Citalopram  Xanax Individual Medical History/ Review of Systems: Changes? :No   Allergies: Amoxicillin, Penicillins, Dextromethorphan, and Phenylephrine  Current Medications:  Current Outpatient Medications:    venlafaxine XR (EFFEXOR XR) 150 MG 24 hr capsule, Take 1 capsule (150 mg total) by mouth daily with breakfast., Disp: 90 capsule, Rfl: 1   venlafaxine XR (EFFEXOR XR) 75 MG 24 hr capsule, Take 1 capsule (75 mg total) by mouth daily with breakfast., Disp: 60 capsule, Rfl: 0   Levonorgestrel (KYLEENA) 19.5 MG IUD, Kyleena 17.5 mcg/24 hrs (71yrs) 19.5mg  intrauterine device  Take 1 device by intrauterine route., Disp: , Rfl:    lithium 300 MG tablet, Take 1 tablet (300 mg total) by mouth 2 (two) times daily., Disp: 180 tablet, Rfl: 1   LORazepam (ATIVAN) 0.5 MG tablet, Take one tablet by mouth daily as needed only for severe anxiety or panic, Disp: 20  tablet, Rfl: 1   LORazepam (ATIVAN) 1 MG tablet, Take one tablet by mouth daily only for severe anxiety or panic attacks., Disp: 30 tablet, Rfl: 0   NORGESTIMATE-ETH ESTRADIOL PO, Take by mouth., Disp: , Rfl:    ondansetron (ZOFRAN) 4 MG tablet, Take 1 tablet (4 mg total) by mouth every 6 (six) hours., Disp: 12 tablet, Rfl: 0   PROAIR RESPICLICK 108 (90 Base) MCG/ACT AEPB, Inhale 2 puffs into the lungs every 4 (four) hours as needed., Disp: , Rfl:    propranolol (INDERAL) 20 MG tablet, Take 1 tablet (20 mg total) by mouth 3 (three) times daily., Disp: 90 tablet, Rfl: 2   propranolol (INDERAL) 20 MG tablet, Take 1 tablet (20 mg total) by mouth 3 (three) times daily., Disp: 90 tablet, Rfl: 2   SUMAtriptan (IMITREX) 100 MG tablet, sumatriptan 100 mg tablet  TAKE 1/2 1 TABLET AS NEEDED FOR MIGRAINE. MAY REPEAT IN 2 HRS AS NEEDED DO NOT EXCEED 2 IN 24 HOURSD, Disp: , Rfl:    venlafaxine (EFFEXOR) 100 MG tablet, Take 1.5 tablets (150 mg total) by mouth daily after breakfast., Disp: 135 tablet, Rfl: 1   Vilazodone HCl 20 MG TABS, TAKE 1 TABLET (20 MG TOTAL) BY MOUTH DAILY AFTER BREAKFAST. (Patient not taking: Reported on 08/16/2022), Disp: 30 tablet, Rfl: 1 Medication Side Effects: none  Family Medical/ Social History: Changes? No  MENTAL HEALTH EXAM:  There were no vitals taken for this visit.There is no height or weight on file to calculate  BMI.  General Appearance: Casual, Neat, and Well Groomed  Eye Contact:  Good  Speech:  Clear and Coherent  Volume:  Normal  Mood:  Anxious, Depressed, and Dysphoric  Affect:  Congruent and Anxious  Thought Process:  Coherent  Orientation:  Full (Time, Place, and Person)  Thought Content: Logical   Suicidal Thoughts:  No  Homicidal Thoughts:  No  Memory:  WNL  Judgement:  Good  Insight:  Good  Psychomotor Activity:  Normal  Concentration:  Concentration: Good  Recall:  Good  Fund of Knowledge: Good  Language: Good  Assets:  Desire for Improvement   ADL's:  Intact  Cognition: WNL  Prognosis:  Good    DIAGNOSES:    ICD-10-CM   1. Generalized anxiety disorder  F41.1 venlafaxine XR (EFFEXOR XR) 75 MG 24 hr capsule    venlafaxine XR (EFFEXOR XR) 150 MG 24 hr capsule    LORazepam (ATIVAN) 1 MG tablet    propranolol (INDERAL) 20 MG tablet    lithium 300 MG tablet    2. Major depressive disorder, recurrent episode, moderate (HCC)  F33.1 venlafaxine XR (EFFEXOR XR) 75 MG 24 hr capsule    venlafaxine XR (EFFEXOR XR) 150 MG 24 hr capsule    lithium 300 MG tablet    3. Panic attack  F41.0 LORazepam (ATIVAN) 1 MG tablet    propranolol (INDERAL) 20 MG tablet    4. Suicidal ideation  R45.851 lithium 300 MG tablet    5. Obsessive-compulsive disorder, unspecified type  F42.9 lithium 300 MG tablet    6. High risk medication use  Z79.899 Lithium level      Receiving Psychotherapy: yes   RECOMMENDATIONS:   Continue Ativan 1 mg only for severe anxiety or panic. Patient understands to use sparingly.  with severe anxiety have returned.  Continue Viibryd 10 mg for two weeks and then stop To restart Effexor 75 mg for two weeks and then increase to 150 mg daily. Continue Inderal 20 mg twice daily as needed. Ordered Lithium Levels Will report worsening symptoms  or side effects promptly. Follow up in 5 months to reassess. To schedule on Christmas break.  Reinforced after hours emergency number and suicide hotline.    Greater than 50% of 30 min face to face time with patient was spent on counseling and coordination of care.  Having night mares and vivid dreams with Viibryd that is effecting her sleep. We discussed her desire to go back on Effexor.  Discussed potential benefits, risk, and side effects of benzodiazepines to include potential risk of tolerance and dependence, as well as possible drowsiness.  Advised patient not to drive if experiencing drowsiness and to take lowest possible effective dose to minimize risk of dependence and  tolerance.  She will continue in therapy. She verbally contracted for safety. Reviewed weight gain. She would like to discuss new med with PCP.  Reviewed PDMP     Joan Flores, NP

## 2022-08-18 LAB — LITHIUM LEVEL: Lithium Lvl: 0.5 mmol/L — ABNORMAL LOW (ref 0.6–1.2)

## 2022-08-23 ENCOUNTER — Ambulatory Visit (INDEPENDENT_AMBULATORY_CARE_PROVIDER_SITE_OTHER): Payer: 59 | Admitting: Psychiatry

## 2022-08-23 DIAGNOSIS — F3341 Major depressive disorder, recurrent, in partial remission: Secondary | ICD-10-CM | POA: Diagnosis not present

## 2022-08-23 DIAGNOSIS — F4010 Social phobia, unspecified: Secondary | ICD-10-CM | POA: Diagnosis not present

## 2022-08-23 DIAGNOSIS — F17291 Nicotine dependence, other tobacco product, in remission: Secondary | ICD-10-CM

## 2022-08-23 DIAGNOSIS — F411 Generalized anxiety disorder: Secondary | ICD-10-CM

## 2022-08-23 DIAGNOSIS — F4310 Post-traumatic stress disorder, unspecified: Secondary | ICD-10-CM | POA: Diagnosis not present

## 2022-08-23 DIAGNOSIS — Z654 Victim of crime and terrorism: Secondary | ICD-10-CM

## 2022-08-23 DIAGNOSIS — F1011 Alcohol abuse, in remission: Secondary | ICD-10-CM

## 2022-08-23 DIAGNOSIS — F1211 Cannabis abuse, in remission: Secondary | ICD-10-CM
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
## 2022-08-29 ENCOUNTER — Ambulatory Visit: Payer: 59 | Admitting: Psychiatry

## 2022-09-07 ENCOUNTER — Other Ambulatory Visit: Payer: Self-pay | Admitting: Behavioral Health

## 2022-09-07 DIAGNOSIS — F411 Generalized anxiety disorder: Secondary | ICD-10-CM

## 2022-09-07 DIAGNOSIS — F331 Major depressive disorder, recurrent, moderate: Secondary | ICD-10-CM

## 2022-09-11 ENCOUNTER — Other Ambulatory Visit: Payer: Self-pay | Admitting: Behavioral Health

## 2022-09-11 DIAGNOSIS — F331 Major depressive disorder, recurrent, moderate: Secondary | ICD-10-CM

## 2022-09-11 DIAGNOSIS — F411 Generalized anxiety disorder: Secondary | ICD-10-CM

## 2022-09-11 DIAGNOSIS — F429 Obsessive-compulsive disorder, unspecified: Secondary | ICD-10-CM

## 2022-09-11 DIAGNOSIS — R45851 Suicidal ideations: Secondary | ICD-10-CM

## 2022-09-17 ENCOUNTER — Other Ambulatory Visit: Payer: Self-pay | Admitting: Behavioral Health

## 2022-09-17 DIAGNOSIS — F410 Panic disorder [episodic paroxysmal anxiety] without agoraphobia: Secondary | ICD-10-CM

## 2022-09-17 DIAGNOSIS — F3341 Major depressive disorder, recurrent, in partial remission: Secondary | ICD-10-CM

## 2022-09-17 DIAGNOSIS — F411 Generalized anxiety disorder: Secondary | ICD-10-CM

## 2023-01-16 ENCOUNTER — Ambulatory Visit: Payer: 59 | Admitting: Behavioral Health

## 2023-01-23 ENCOUNTER — Encounter: Payer: Self-pay | Admitting: Behavioral Health

## 2023-01-23 ENCOUNTER — Ambulatory Visit: Payer: 59 | Admitting: Behavioral Health

## 2023-01-23 DIAGNOSIS — F331 Major depressive disorder, recurrent, moderate: Secondary | ICD-10-CM

## 2023-01-23 DIAGNOSIS — F411 Generalized anxiety disorder: Secondary | ICD-10-CM | POA: Diagnosis not present

## 2023-01-23 DIAGNOSIS — F429 Obsessive-compulsive disorder, unspecified: Secondary | ICD-10-CM

## 2023-01-23 DIAGNOSIS — F4001 Agoraphobia with panic disorder: Secondary | ICD-10-CM

## 2023-01-23 DIAGNOSIS — R45851 Suicidal ideations: Secondary | ICD-10-CM

## 2023-01-23 MED ORDER — VENLAFAXINE HCL ER 150 MG PO CP24: 150.0000 mg | ORAL_CAPSULE | Freq: Every day | ORAL | 1 refills | Status: DC

## 2023-01-23 MED ORDER — PROPRANOLOL HCL 20 MG PO TABS: 20.0000 mg | ORAL_TABLET | Freq: Three times a day (TID) | ORAL | 2 refills | Status: DC

## 2023-01-23 MED ORDER — LITHIUM CARBONATE 300 MG PO TABS: 300.0000 mg | ORAL_TABLET | Freq: Two times a day (BID) | ORAL | 1 refills | Status: DC

## 2023-01-23 NOTE — Progress Notes (Signed)
[facility]  Patient ID: Michelle Merritt,  MRN: [id number]  PCP: Mosetta Putt, MD  Date of Evaluation: 01/23/2023 Time spent:30 minutes  Chief Complaint:  Chief Complaint   Anxiety; Depression; Follow-up; Medication Refill; Patient Education     HISTORY/CURRENT STATUS: HPI 22 year old female presents for follow up and medication management.  Anxiety has improved since last visit. Effexor is working well right now. In [employer] in [place]. First semester went well.  Panic attacks have subsided recently.  Requesting no medication changes this visit.  She has had no SI in several months and is happy about how lithium has helped. Says her anxiety today is 2/10 and depression 2/10.  She is sleeping 7 plus hours at night. No current SI/ No HI. No mania, no psychosis. Agrees to f/u labs with [facility] to obtain Lithium levels every three months. No concerns with last Lithium levels.   Past psychiatric medication trials Sertraline Fluoxetine Citalopram  Xanax Individual Medical History/ Review of Systems: Changes? :No   Allergies: Amoxicillin, Penicillins, Dextromethorphan, and Phenylephrine  Current Medications:  Current Outpatient Medications:    Levonorgestrel (KYLEENA) 19.5 MG IUD, Kyleena 17.5 mcg/24 hrs (52yrs) 19.5mg  intrauterine device  Take 1 device by intrauterine route., Disp: , Rfl:    lithium 300 MG tablet, Take 1 tablet (300 mg total) by mouth 2 (two) times daily., Disp: 180 tablet, Rfl: 1   LORazepam (ATIVAN) 0.5 MG tablet, Take one tablet by mouth daily as needed only for severe anxiety or panic, Disp: 20 tablet, Rfl: 1   LORazepam (ATIVAN) 1 MG tablet, Take one tablet by mouth daily only for severe anxiety or panic attacks., Disp: 30 tablet, Rfl: 0   NORGESTIMATE-ETH ESTRADIOL PO, Take by mouth., Disp: , Rfl:    ondansetron (ZOFRAN) 4 MG tablet, Take 1 tablet (4 mg total) by mouth every 6 (six) hours., Disp: 12 tablet, Rfl: 0   PROAIR RESPICLICK 108  (90 Base) MCG/ACT AEPB, Inhale 2 puffs into the lungs every 4 (four) hours as needed., Disp: , Rfl:    propranolol (INDERAL) 20 MG tablet, Take 1 tablet (20 mg total) by mouth 3 (three) times daily., Disp: 90 tablet, Rfl: 2   propranolol (INDERAL) 20 MG tablet, Take 1 tablet (20 mg total) by mouth 3 (three) times daily., Disp: 90 tablet, Rfl: 2   SUMAtriptan (IMITREX) 100 MG tablet, sumatriptan 100 mg tablet  TAKE 1/2 1 TABLET AS NEEDED FOR MIGRAINE. MAY REPEAT IN 2 HRS AS NEEDED DO NOT EXCEED 2 IN 24 HOURSD, Disp: , Rfl:    venlafaxine (EFFEXOR) 100 MG tablet, Take 1.5 tablets (150 mg total) by mouth daily after breakfast., Disp: 135 tablet, Rfl: 1   venlafaxine XR (EFFEXOR XR) 150 MG 24 hr capsule, Take 1 capsule (150 mg total) by mouth daily with breakfast., Disp: 90 capsule, Rfl: 1   venlafaxine XR (EFFEXOR XR) 75 MG 24 hr capsule, Take 1 capsule (75 mg total) by mouth daily with breakfast., Disp: 60 capsule, Rfl: 0 Medication Side Effects: none  Family Medical/ Social History: Changes? No  MENTAL HEALTH EXAM:  There were no vitals taken for this visit.There is no height or weight on file to calculate BMI.  General Appearance: Casual, Neat, and Well Groomed  Eye Contact:  Good  Speech:  Clear and Coherent  Volume:  Normal  Mood:  NA  Affect:  Appropriate  Thought Process:  Coherent  Orientation:  Full (Time, Place, and Person)  Thought Content: Logical  Suicidal Thoughts:  No  Homicidal Thoughts:  No  Memory:  WNL  Judgement:  Good  Insight:  Good  Psychomotor Activity:  Normal  Concentration:  Concentration: Good  Recall:  Good  Fund of Knowledge: Good  Language: Good  Assets:  Desire for Improvement  ADL's:  Intact  Cognition: WNL  Prognosis:  Good    DIAGNOSES:    ICD-10-CM   1. Generalized anxiety disorder  F41.1 lithium 300 MG tablet    propranolol (INDERAL) 20 MG tablet    venlafaxine XR (EFFEXOR XR) 150 MG 24 hr capsule    2. Major depressive disorder,  recurrent episode, moderate (HCC)  F33.1 lithium 300 MG tablet    venlafaxine XR (EFFEXOR XR) 150 MG 24 hr capsule    3. Suicidal ideation  R45.851 lithium 300 MG tablet    4. Obsessive-compulsive disorder, unspecified type  F42.9 lithium 300 MG tablet    5. Panic disorder with agoraphobia  F40.01 propranolol (INDERAL) 20 MG tablet      Receiving Psychotherapy: No    RECOMMENDATIONS:   Continue Ativan 1 mg only for severe anxiety or panic. Patient understands to use sparingly.  with severe anxiety have returned.  To continue on Effexor 150 mg daily Continue Lithium 300 mg daily at bedtime Continue Inderal 20 mg twice daily as needed. Lithium levels to be obtained by student services at school in [place] Will report worsening symptoms  or side effects promptly. Follow up in 3 months to reassess. Late March Video visit Reinforced after hours emergency number and suicide hotline.    Greater than 50% of 30 min face to face time with patient was spent on counseling and coordination of care.  We discussed her great stability since going back on Effexor. She is very happy with her current medication regimen. Discussed potential benefits, risk, and side effects of benzodiazepines to include potential risk of tolerance and dependence, as well as possible drowsiness.  Advised patient not to drive if experiencing drowsiness and to take lowest possible effective dose to minimize risk of dependence and tolerance.  She will continue in therapy. She verbally contracted for safety. Reviewed weight gain. She would like to discuss new med with PCP.  Reviewed PDMP         Joan Flores, NP

## 2023-04-24 ENCOUNTER — Telehealth: Payer: 59 | Admitting: Behavioral Health

## 2023-04-24 ENCOUNTER — Encounter: Payer: Self-pay | Admitting: Behavioral Health

## 2023-04-24 DIAGNOSIS — F429 Obsessive-compulsive disorder, unspecified: Secondary | ICD-10-CM | POA: Diagnosis not present

## 2023-04-24 DIAGNOSIS — R45851 Suicidal ideations: Secondary | ICD-10-CM | POA: Diagnosis not present

## 2023-04-24 DIAGNOSIS — F4001 Agoraphobia with panic disorder: Secondary | ICD-10-CM

## 2023-04-24 DIAGNOSIS — F411 Generalized anxiety disorder: Secondary | ICD-10-CM

## 2023-04-24 DIAGNOSIS — F331 Major depressive disorder, recurrent, moderate: Secondary | ICD-10-CM

## 2023-04-24 DIAGNOSIS — Z7289 Other problems related to lifestyle: Secondary | ICD-10-CM

## 2023-04-24 MED ORDER — VENLAFAXINE HCL ER 150 MG PO CP24: 150.0000 mg | ORAL_CAPSULE | Freq: Every day | ORAL | 1 refills | Status: DC

## 2023-04-24 MED ORDER — LITHIUM CARBONATE 300 MG PO TABS: ORAL_TABLET | ORAL | 1 refills | Status: DC

## 2023-04-24 MED ORDER — PROPRANOLOL HCL 20 MG PO TABS: 20.0000 mg | ORAL_TABLET | Freq: Three times a day (TID) | ORAL | 2 refills | Status: AC

## 2023-04-24 NOTE — Progress Notes (Signed)
 Michelle Merritt [id number] 06-06-2000 23 y.o.  Virtual Visit via Video Note  I connected with pt @ on 04/24/23 at  9:00 AM EDT by a video enabled telemedicine application and verified that I am speaking with the correct person using two identifiers.   I discussed the limitations of evaluation and management by telemedicine and the availability of in person appointments. The patient expressed understanding and agreed to proceed.  I discussed the assessment and treatment plan with the patient. The patient was provided an opportunity to ask questions and all were answered. The patient agreed with the plan and demonstrated an understanding of the instructions.   The patient was advised to call back or seek an in-person evaluation if the symptoms worsen or if the condition fails to improve as anticipated.  I provided 30 minutes of non-face-to-face time during this encounter.  The patient was located at home.  The provider was located at [facility].   Joan Flores, NP   Subjective:   Patient ID:  Michelle Merritt is a 23 y.o. (DOB 2000-05-10) female.  Chief Complaint:  Chief Complaint  Patient presents with   Depression   Anxiety   Follow-up   Patient Education   Medication Problem   Stress   Self Harm    HPI  "[name]", 23 year old female presents for follow up and medication management. She is smiling today and pleasant.  Currently in [place] in [place]. She  reports having some relapse with unknown triggers. Says she linked up with wrong kind of friends and has had periods of increased ETOH intake and impulsiveness with sexual activity.  Had some grandparents pass in her family. She says that she participating in cutting for the first time about one month ago and and increased rumination with SI but not as severe pre lithium.  For now she is distancing herself from friend base and does not want to adjust medications. She is open to trial of NAC  to see  if it may help with urge to cut or other obsessive compulsive behaviors. .She understands that ETOH usually is a catalyst in relapse. She would like to find therapist there because she feels there is more accountability. She would consider Lithium increase if NAC does not help. It had worked very well for her in the past for SI and self harm. She feels safe and verbally contracts for safety. She has emergency resources available to her on campus and says she will reach out if necessary.  Says her anxiety today is 4/10 and depression 4/10.  She is sleeping 7 plus hours at night. No current SI/ No HI. No mania, no psychosis. No cutting in one month. She is reaching out to campus clinic to have lithium levels checked on regular basis.    Past psychiatric medication trials Sertraline Fluoxetine Citalopram  Xanax   Review of Systems:  Review of Systems  Constitutional: Negative.   Allergic/Immunologic: Negative.     Medications: I have reviewed the patient's current medications.  Current Outpatient Medications  Medication Sig Dispense Refill   Levonorgestrel (KYLEENA) 19.5 MG IUD Kyleena 17.5 mcg/24 hrs (6yrs) 19.5mg  intrauterine device  Take 1 device by intrauterine route.     lithium 300 MG tablet Take two tablets by mouth at bedtime daily 180 tablet 1   LORazepam (ATIVAN) 1 MG tablet Take one tablet by mouth daily only for severe anxiety or panic attacks. 30 tablet 0   NORGESTIMATE-ETH ESTRADIOL PO Take by mouth.  ondansetron (ZOFRAN) 4 MG tablet Take 1 tablet (4 mg total) by mouth every 6 (six) hours. 12 tablet 0   PROAIR RESPICLICK 108 (90 Base) MCG/ACT AEPB Inhale 2 puffs into the lungs every 4 (four) hours as needed.     propranolol (INDERAL) 20 MG tablet Take 1 tablet (20 mg total) by mouth 3 (three) times daily. 90 tablet 2   propranolol (INDERAL) 20 MG tablet Take 1 tablet (20 mg total) by mouth 3 (three) times daily. 90 tablet 2   SUMAtriptan (IMITREX) 100 MG tablet sumatriptan  100 mg tablet  TAKE 1/2 1 TABLET AS NEEDED FOR MIGRAINE. MAY REPEAT IN 2 HRS AS NEEDED DO NOT EXCEED 2 IN 24 HOURSD     venlafaxine XR (EFFEXOR XR) 150 MG 24 hr capsule Take 1 capsule (150 mg total) by mouth daily with breakfast. 90 capsule 1   No current facility-administered medications for this visit.    Medication Side Effects: None  Allergies:  Allergies  Allergen Reactions   Amoxicillin Hives   Penicillins Hives   Dextromethorphan Other (See Comments)   Phenylephrine Other (See Comments)    Past Medical History:  Diagnosis Date   Anxiety     History reviewed. No pertinent family history.  Social History   Socioeconomic History   Marital status: Single    Spouse name: Not on file   Number of children: Not on file   Years of education: Not on file   Highest education level: Not on file  Occupational History   Not on file  Tobacco Use   Smoking status: Former    Types: E-cigarettes    Start date: 2021    Quit date: 2022    Years since quitting: 3.2   Smokeless tobacco: Never   Tobacco comments:    some days smoker  Vaping Use   Vaping status: Some Days  Substance and Sexual Activity   Alcohol use: Not Currently    Comment: soc   Drug use: Not Currently    Types: Marijuana   Sexual activity: Not on file  Other Topics Concern   Not on file  Social History Narrative   Not on file   Social Drivers of Health   Financial Resource Strain: Not on file  Food Insecurity: Not on file  Transportation Needs: Not on file  Physical Activity: Not on file  Stress: Not on file  Social Connections: Unknown (06/15/2021)   Received from [facility]   Social Network    Social Network: Not on file  Intimate Partner Violence: Unknown (05/07/2021)   [facility]   HITS    Physically Hurt: Not on file    Insult or Talk Down To: Not on file    Threaten Physical Harm: Not on file    Scream or Curse: Not on file    Past  Medical History, Surgical history, Social history, and Family history were reviewed and updated as appropriate.   Please see review of systems for further details on the patient's review from today.   Objective:   Physical Exam:  There were no vitals taken for this visit.  Physical Exam  Lab Review:     Component Value Date/Time   NA 141 11/27/2021 1055   K 3.9 11/27/2021 1055   CL 105 11/27/2021 1055   CO2 23 11/27/2021 1055   GLUCOSE 91 11/27/2021 1055   BUN 7 11/27/2021 1055   CREATININE 0.63 11/27/2021 1055   CALCIUM  9.7 11/27/2021 1055   PROT 7.9 11/27/2021 1055   ALBUMIN 5.1 (H) 11/27/2021 1055   AST 17 11/27/2021 1055   ALT 10 11/27/2021 1055   ALKPHOS 59 11/27/2021 1055   BILITOT 0.4 11/27/2021 1055   GFRNONAA >60 11/27/2021 1055   GFRAA >60 01/23/2019 2014       Component Value Date/Time   WBC 6.6 11/27/2021 1055   RBC 4.60 11/27/2021 1055   HGB 13.7 11/27/2021 1055   HCT 40.4 11/27/2021 1055   PLT 299 11/27/2021 1055   MCV 87.8 11/27/2021 1055   MCH 29.8 11/27/2021 1055   MCHC 33.9 11/27/2021 1055   RDW 11.9 11/27/2021 1055   LYMPHSABS 3.1 01/23/2019 1927   MONOABS 0.7 01/23/2019 1927   EOSABS 0.1 01/23/2019 1927   BASOSABS 0.1 01/23/2019 1927    Lithium Lvl  Date Value Ref Range Status  08/17/2022 0.5 (L) 0.6 - 1.2 mmol/L Final     No results found for: "PHENYTOIN", "PHENOBARB", "VALPROATE", "CBMZ"   .res Assessment: Plan:    Continue Ativan 1 mg only for severe anxiety or panic.  To start NAC 600 mg daily for one week, then 1200 mg daily. To continue on Effexor 150 mg daily Continue Lithium 600 mg daily at bedtime Continue Inderal 20 mg twice daily as needed. Lithium levels to be obtained by student services at school in [place] Will report worsening symptoms  or side effects promptly. Follow up in 3 months to reassess.  Recommended she obtain psychotherapy locally. Reinforced after hours emergency number and suicide hotline.    Greater  than 50% of 30 min face to face time with patient was spent on counseling and coordination of care. Discussed her recent decline with impulsive behaviors. She has been partying and drinking heavy at time on campus. I reinforced risk of taking her medications with ETOH. Discussed risk of unsafe sex and her report of having irresponsible sexual activity.  Discussed potential benefits, risk, and side effects of benzodiazepines to include potential risk of tolerance and dependence, as well as possible drowsiness.  Advised patient not to drive if experiencing drowsiness and to take lowest possible effective dose to minimize risk of dependence and tolerance.  Agrees to seek therapy locally in [place]. She verbally contracted for safety..  To have lithium levels check at school clinic [place], Having periods. Reinforced risk of medication and pregnancy Reviewed PDMP     Arlys John A. Adalia Pettis, NP          Ritamarie was seen today for depression, anxiety, follow-up, patient education, medication problem, stress and self harm.  Diagnoses and all orders for this visit:  Generalized anxiety disorder -     venlafaxine XR (EFFEXOR XR) 150 MG 24 hr capsule; Take 1 capsule (150 mg total) by mouth daily with breakfast. -     lithium 300 MG tablet; Take two tablets by mouth at bedtime daily -     propranolol (INDERAL) 20 MG tablet; Take 1 tablet (20 mg total) by mouth 3 (three) times daily.  Major depressive disorder, recurrent episode, moderate (HCC) -     venlafaxine XR (EFFEXOR XR) 150 MG 24 hr capsule; Take 1 capsule (150 mg total) by mouth daily with breakfast. -     lithium 300 MG tablet; Take two tablets by mouth at bedtime daily  Suicidal ideation -     lithium 300 MG tablet; Take two tablets by mouth at bedtime daily  Obsessive-compulsive disorder, unspecified type -  lithium 300 MG tablet; Take two tablets by mouth at bedtime daily  Deliberate self-cutting  Panic disorder with agoraphobia -      propranolol (INDERAL) 20 MG tablet; Take 1 tablet (20 mg total) by mouth 3 (three) times daily.     Please see After Visit Summary for patient specific instructions.  No future appointments.  No orders of the defined types were placed in this encounter.     -------------------------------

## 2023-06-21 ENCOUNTER — Telehealth: Admitting: Behavioral Health

## 2023-06-21 ENCOUNTER — Encounter: Payer: Self-pay | Admitting: Behavioral Health

## 2023-06-21 DIAGNOSIS — R45851 Suicidal ideations: Secondary | ICD-10-CM

## 2023-06-21 DIAGNOSIS — F331 Major depressive disorder, recurrent, moderate: Secondary | ICD-10-CM

## 2023-06-21 DIAGNOSIS — F411 Generalized anxiety disorder: Secondary | ICD-10-CM | POA: Diagnosis not present

## 2023-06-21 DIAGNOSIS — F429 Obsessive-compulsive disorder, unspecified: Secondary | ICD-10-CM

## 2023-06-21 MED ORDER — LITHIUM CARBONATE 300 MG PO TABS: ORAL_TABLET | ORAL | 1 refills | Status: DC

## 2023-06-21 MED ORDER — VENLAFAXINE HCL ER 150 MG PO CP24: 150.0000 mg | ORAL_CAPSULE | Freq: Every day | ORAL | 1 refills | Status: DC

## 2023-06-21 NOTE — Progress Notes (Addendum)
 [facility]  Patient ID: Michelle Merritt,  MRN: [id number]  PCP: Candise Chambers, MD  Date of Evaluation: 07/04/2023 Time spent:30 minutes  Virtual Visit via Video Note   I connected with pt @ on 06/21/23 at  3:30 PM  EDT by a video enabled telemedicine application and verified that I am speaking with the correct person using two identifiers.   I discussed the limitations of evaluation and management by telemedicine and the availability of in person appointments. The patient expressed understanding and agreed to proceed.   I discussed the assessment and treatment plan with the patient. The patient was provided an opportunity to ask questions and all were answered. The patient agreed with the plan and demonstrated an understanding of the instructions.   The patient was advised to call back or seek an in-person evaluation if the symptoms worsen or if the condition fails to improve as anticipated.   I provided 30 minutes of non-face-to-face time during this encounter.  The patient was located at home.  The provider was located at [facility].     Lincoln Renshaw, NP   Chief Complaint:  Chief Complaint   Depression; Anxiety; Follow-up; Medication Refill; Patient Education     HISTORY/CURRENT STATUS: HPI  "Michelle Merritt", 23 year old female presents via video visit for follow up and medication management. She is smiling today and pleasant.  Currently in [employer] in [place] . She is currently back in [place] and will be living in [place] for summer with a grandmother. 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 Says she is doing very well currently. She does not want to change any medication this visit. No recent cutting or severe ruminations.  Feels like NAC has helped with the urges for any cutting. She understands that ETOH usually is a catalyst in relapse. She would like to find therapist there because she feels there is more accountability.  She feels  safe and verbally contracts for safety. She has emergency resources available to her on campus and says she will reach out if necessary.  Says her anxiety today is 2/10 and depression 2/10.  She is sleeping 7 plus hours at night. No current SI/ No HI. No mania, no psychosis.    Past psychiatric medication trials Sertraline Fluoxetine Citalopram  Xanax  Individual Medical History/ Review of Systems: Changes? :No   Allergies: Amoxicillin, Penicillins, Dextromethorphan, and Phenylephrine  Current Medications:  Current Outpatient Medications:    Levonorgestrel (KYLEENA) 19.5 MG IUD, Kyleena 17.5 mcg/24 hrs (13yrs) 19.5mg  intrauterine device  Take 1 device by intrauterine route., Disp: , Rfl:    lithium  300 MG tablet, Take two tablets by mouth at bedtime daily, Disp: 180 tablet, Rfl: 1   LORazepam  (ATIVAN ) 1 MG tablet, Take one tablet by mouth daily only for severe anxiety or panic attacks., Disp: 30 tablet, Rfl: 0   NORGESTIMATE-ETH ESTRADIOL PO, Take by mouth., Disp: , Rfl:    ondansetron  (ZOFRAN ) 4 MG tablet, Take 1 tablet (4 mg total) by mouth every 6 (six) hours., Disp: 12 tablet, Rfl: 0   PROAIR RESPICLICK 108 (90 Base) MCG/ACT AEPB, Inhale 2 puffs into the lungs every 4 (four) hours as needed., Disp: , Rfl:    propranolol  (INDERAL ) 20 MG tablet, Take 1 tablet (20 mg total) by mouth 3 (three) times daily., Disp: 90 tablet, Rfl: 2   propranolol  (INDERAL ) 20 MG tablet, Take 1 tablet (20 mg total) by mouth 3 (three) times daily., Disp: 90 tablet,  Rfl: 2   SUMAtriptan (IMITREX) 100 MG tablet, sumatriptan 100 mg tablet  TAKE 1/2 1 TABLET AS NEEDED FOR MIGRAINE. MAY REPEAT IN 2 HRS AS NEEDED DO NOT EXCEED 2 IN 24 HOURSD, Disp: , Rfl:    venlafaxine  XR (EFFEXOR  XR) 150 MG 24 hr capsule, Take 1 capsule (150 mg total) by mouth daily with breakfast., Disp: 90 capsule, Rfl: 1 Medication Side Effects: none  Family Medical/ Social History: Changes? No  MENTAL HEALTH EXAM:  There were no vitals taken  for this visit.There is no height or weight on file to calculate BMI.  General Appearance: Casual, Neat, and Well Groomed  Eye Contact:  Good  Speech:  Clear and Coherent  Volume:  Normal  Mood:  NA  Affect:  Appropriate  Thought Process:  Coherent  Orientation:  Full (Time, Place, and Person)  Thought Content: Logical   Suicidal Thoughts:  No  Homicidal Thoughts:  No  Memory:  WNL  Judgement:  Good  Insight:  Good  Psychomotor Activity:  Normal  Concentration:  Concentration: Good  Recall:  Good  Fund of Knowledge: Good  Language: Good  Assets:  Desire for Improvement  ADL's:  Intact  Cognition: WNL  Prognosis:  Good    DIAGNOSES:    ICD-10-CM   1. Generalized anxiety disorder  F41.1 venlafaxine  XR (EFFEXOR  XR) 150 MG 24 hr capsule    lithium  300 MG tablet    2. Major depressive disorder, recurrent episode, moderate (HCC)  F33.1 venlafaxine  XR (EFFEXOR  XR) 150 MG 24 hr capsule    lithium  300 MG tablet    3. Suicidal ideation  R45.851 lithium  300 MG tablet    4. Obsessive-compulsive disorder, unspecified type  F42.9 lithium  300 MG tablet      Receiving Psychotherapy: No    RECOMMENDATIONS:   Continue Ativan  1 mg only for severe anxiety or panic.  Continue NAC 1200 mg daily  To continue on Effexor  150 mg daily Continue Lithium  600  mg daily at bedtime Continue Inderal  20 mg twice daily as needed. Will report worsening symptoms  or side effects promptly. Need lithium  levels next visit. Follow up in 3 months to reassess.  Recommended she obtain psychotherapy locally. Reinforced after hours emergency number and suicide hotline.    Greater than 50% of 30 min video visit time with patient was spent on counseling and coordination of care. Discussed her recent improvement with impulsivity. She is home for the summer and says she has clearer thinking. She will be living with Grandmother at [facility].  Discussed potential benefits, risk, and side effects of  benzodiazepines to include potential risk of tolerance and dependence, as well as possible drowsiness.  Advised patient not to drive if experiencing drowsiness and to take lowest possible effective dose to minimize risk of dependence and tolerance.  She verbally contracted for safety..  To have lithium  levels check at school clinic [place], Having periods. Reinforced risk of medication and pregnancy Reviewed PDMP       Lincoln Renshaw, NP

## 2023-08-27 ENCOUNTER — Other Ambulatory Visit: Payer: Self-pay | Admitting: Behavioral Health

## 2023-08-27 DIAGNOSIS — F331 Major depressive disorder, recurrent, moderate: Secondary | ICD-10-CM

## 2023-08-27 DIAGNOSIS — F411 Generalized anxiety disorder: Secondary | ICD-10-CM

## 2023-09-05 ENCOUNTER — Ambulatory Visit (INDEPENDENT_AMBULATORY_CARE_PROVIDER_SITE_OTHER): Admitting: Behavioral Health

## 2023-09-05 ENCOUNTER — Encounter: Payer: Self-pay | Admitting: Behavioral Health

## 2023-09-05 DIAGNOSIS — F411 Generalized anxiety disorder: Secondary | ICD-10-CM | POA: Diagnosis not present

## 2023-09-05 DIAGNOSIS — F429 Obsessive-compulsive disorder, unspecified: Secondary | ICD-10-CM

## 2023-09-05 DIAGNOSIS — F331 Major depressive disorder, recurrent, moderate: Secondary | ICD-10-CM

## 2023-09-05 DIAGNOSIS — R45851 Suicidal ideations: Secondary | ICD-10-CM | POA: Diagnosis not present

## 2023-09-05 MED ORDER — VENLAFAXINE HCL ER 150 MG PO CP24: 150.0000 mg | ORAL_CAPSULE | Freq: Every day | ORAL | 1 refills | Status: DC

## 2023-09-05 MED ORDER — LITHIUM CARBONATE 300 MG PO TABS: ORAL_TABLET | ORAL | 1 refills | Status: DC

## 2023-09-05 NOTE — Progress Notes (Signed)
 [facility]  Patient ID: Michelle Merritt,  MRN: [id number]  PCP: Windy Coy, MD  Date of Evaluation: 09/05/2023 Time spent:30 minutes  Chief Complaint:  Chief Complaint   Depression; Anxiety; Follow-up; Patient Education; Medication Refill     HISTORY/CURRENT STATUS: HPI Michelle Merritt, 23 year old female presents  for follow up and medication management. She is smiling today and pleasant.  Currently in [employer] in [place] . Spent the summer with Grandmother in [place]. She also was interning over the summer. She is heading back to [place]  this week. So far doing very well with current medication regimen. She is reporting difficulty getting up in the morning and very difficult to rouse. She will trying taking her medications at dinner time to see if it helps. No recent cutting or severe ruminations.  Feels like NAC has helped with the urges for any cutting.  She feels safe and verbally contracts for safety. She has emergency resources available to her on campus and says she will reach out if necessary.  Says her anxiety today is 2/10 and depression 2/10.  She is sleeping 7 plus hours at night. No current SI/ No HI. No mania, no psychosis.     Past psychiatric medication trials Sertraline Fluoxetine Citalopram  Xanax    Individual Medical History/ Review of Systems: Changes? :No   Allergies: Amoxicillin, Penicillins, Dextromethorphan, and Phenylephrine  Current Medications:  Current Outpatient Medications:    Levonorgestrel (KYLEENA) 19.5 MG IUD, Kyleena 17.5 mcg/24 hrs (65yrs) 19.5mg  intrauterine device  Take 1 device by intrauterine route., Disp: , Rfl:    lithium  300 MG tablet, Take two tablets by mouth at bedtime daily, Disp: 180 tablet, Rfl: 1   LORazepam  (ATIVAN ) 1 MG tablet, Take one tablet by mouth daily only for severe anxiety or panic attacks., Disp: 30 tablet, Rfl: 0   NORGESTIMATE-ETH ESTRADIOL PO, Take by mouth., Disp: , Rfl:    ondansetron   (ZOFRAN ) 4 MG tablet, Take 1 tablet (4 mg total) by mouth every 6 (six) hours., Disp: 12 tablet, Rfl: 0   PROAIR RESPICLICK 108 (90 Base) MCG/ACT AEPB, Inhale 2 puffs into the lungs every 4 (four) hours as needed., Disp: , Rfl:    propranolol  (INDERAL ) 20 MG tablet, Take 1 tablet (20 mg total) by mouth 3 (three) times daily., Disp: 90 tablet, Rfl: 2   propranolol  (INDERAL ) 20 MG tablet, Take 1 tablet (20 mg total) by mouth 3 (three) times daily., Disp: 90 tablet, Rfl: 2   SUMAtriptan (IMITREX) 100 MG tablet, sumatriptan 100 mg tablet  TAKE 1/2 1 TABLET AS NEEDED FOR MIGRAINE. MAY REPEAT IN 2 HRS AS NEEDED DO NOT EXCEED 2 IN 24 HOURSD, Disp: , Rfl:    venlafaxine  XR (EFFEXOR -XR) 150 MG 24 hr capsule, Take 1 capsule (150 mg total) by mouth daily with breakfast., Disp: 90 capsule, Rfl: 1 Medication Side Effects: none  Family Medical/ Social History: Changes? No  MENTAL HEALTH EXAM:  There were no vitals taken for this visit.There is no height or weight on file to calculate BMI.  General Appearance: Casual and Neat  Eye Contact:  Good  Speech:  Clear and Coherent  Volume:  Normal  Mood:  NA  Affect:  Appropriate  Thought Process:  Coherent  Orientation:  Full (Time, Place, and Person)  Thought Content: Logical   Suicidal Thoughts:  No  Homicidal Thoughts:  No  Memory:  WNL  Judgement:  Good  Insight:  Good  Psychomotor Activity:  Normal  Concentration:  Concentration: Good  Recall:  Good  Fund of Knowledge: Good  Language: Good  Assets:  Desire for Improvement  ADL's:  Intact  Cognition: WNL  Prognosis:  Good    DIAGNOSES:    ICD-10-CM   1. Generalized anxiety disorder  F41.1 venlafaxine  XR (EFFEXOR -XR) 150 MG 24 hr capsule    lithium  300 MG tablet    2. Major depressive disorder, recurrent episode, moderate (HCC)  F33.1 venlafaxine  XR (EFFEXOR -XR) 150 MG 24 hr capsule    lithium  300 MG tablet    3. Suicidal ideation  R45.851 lithium  300 MG tablet    4.  Obsessive-compulsive disorder, unspecified type  F42.9 lithium  300 MG tablet      Receiving Psychotherapy: No    RECOMMENDATIONS:  Continue Ativan  1 mg only for severe anxiety or panic.  Continue NAC 1200 mg daily  To continue on Effexor  150 mg daily Continue Lithium  600  mg daily at bedtime Continue Inderal  20 mg twice daily as needed. Will report worsening symptoms  or side effects promptly. Need lithium  levels next visit. Follow up in 4 months to reassess while on winter break  Recommended she obtain psychotherapy locally. Reinforced after hours emergency number and suicide hotline.    Greater than 50% of 30 min face to face visit time with patient was spent on counseling and coordination of care. Discussed her continued good stability over the summer. 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   Discussed potential benefits, risk, and side effects of benzodiazepines to include potential risk of tolerance and dependence, as well as possible drowsiness.  Advised patient not to drive if experiencing drowsiness and to take lowest possible effective dose to minimize risk of dependence and tolerance.  She verbally contracted for safety..  To have lithium  levels check at school clinic [place], Having periods. Reinforced risk of medication and pregnancy Reviewed PDMP      Redell DELENA Pizza, NP

## 2023-09-23 ENCOUNTER — Other Ambulatory Visit: Payer: Self-pay | Admitting: Behavioral Health

## 2023-09-23 DIAGNOSIS — F429 Obsessive-compulsive disorder, unspecified: Secondary | ICD-10-CM

## 2023-09-23 DIAGNOSIS — R45851 Suicidal ideations: Secondary | ICD-10-CM

## 2023-09-23 DIAGNOSIS — F411 Generalized anxiety disorder: Secondary | ICD-10-CM

## 2023-09-23 DIAGNOSIS — F331 Major depressive disorder, recurrent, moderate: Secondary | ICD-10-CM

## 2023-11-27 ENCOUNTER — Other Ambulatory Visit: Payer: Self-pay | Admitting: Behavioral Health

## 2023-11-27 DIAGNOSIS — F331 Major depressive disorder, recurrent, moderate: Secondary | ICD-10-CM

## 2023-11-27 DIAGNOSIS — R45851 Suicidal ideations: Secondary | ICD-10-CM

## 2023-11-27 DIAGNOSIS — F429 Obsessive-compulsive disorder, unspecified: Secondary | ICD-10-CM

## 2023-11-27 DIAGNOSIS — F411 Generalized anxiety disorder: Secondary | ICD-10-CM

## 2023-12-28 ENCOUNTER — Other Ambulatory Visit: Payer: Self-pay | Admitting: Behavioral Health

## 2023-12-28 DIAGNOSIS — F331 Major depressive disorder, recurrent, moderate: Secondary | ICD-10-CM

## 2023-12-28 DIAGNOSIS — F411 Generalized anxiety disorder: Secondary | ICD-10-CM

## 2024-01-18 ENCOUNTER — Ambulatory Visit: Admitting: Behavioral Health

## 2024-01-31 ENCOUNTER — Encounter: Payer: Self-pay | Admitting: Behavioral Health

## 2024-01-31 ENCOUNTER — Ambulatory Visit (INDEPENDENT_AMBULATORY_CARE_PROVIDER_SITE_OTHER): Admitting: Behavioral Health

## 2024-01-31 DIAGNOSIS — F411 Generalized anxiety disorder: Secondary | ICD-10-CM

## 2024-01-31 DIAGNOSIS — F429 Obsessive-compulsive disorder, unspecified: Secondary | ICD-10-CM | POA: Diagnosis not present

## 2024-01-31 DIAGNOSIS — F410 Panic disorder [episodic paroxysmal anxiety] without agoraphobia: Secondary | ICD-10-CM | POA: Diagnosis not present

## 2024-01-31 DIAGNOSIS — R45851 Suicidal ideations: Secondary | ICD-10-CM | POA: Diagnosis not present

## 2024-01-31 DIAGNOSIS — F331 Major depressive disorder, recurrent, moderate: Secondary | ICD-10-CM

## 2024-01-31 MED ORDER — VENLAFAXINE HCL ER 150 MG PO CP24: 150.0000 mg | ORAL_CAPSULE | Freq: Every day | ORAL | 1 refills | Status: AC

## 2024-01-31 MED ORDER — LITHIUM CARBONATE 300 MG PO TABS: 300.0000 mg | ORAL_TABLET | Freq: Two times a day (BID) | ORAL | 1 refills | Status: DC

## 2024-01-31 NOTE — Progress Notes (Signed)
 "     [facility]  Patient ID: Michelle Merritt,  MRN: [id number]  PCP: Windy Coy, MD  Date of Evaluation: 01/31/2024 Time spent:30 minutes  Chief Complaint:  Chief Complaint   Depression; Anxiety; Follow-up; Medication Refill; Patient Education     HISTORY/CURRENT STATUS: HPI Michelle Merritt, 23 year old female presents  for follow up and medication management. She is smiling today and pleasant.  Currently in [employer] in [place] .  She came home to visit for the holidays.  Is pleasant and calm today.  Requesting no medication adjustments or changes.  Doing very well in school.  Has a new boyfriend that is also a careers information officer at same school.  NAC continues to be very helpful for compulsions.  She feels safe and verbally contracts for safety. She has emergency resources available to her on campus and says she will reach out if necessary.  Says her anxiety today is 2/10 and depression 2/10.  She is sleeping 7 plus hours at night. No current SI/ No HI. No mania, no psychosis.     Past psychiatric medication trials Sertraline Fluoxetine Citalopram  Xanax        Individual Medical History/ Review of Systems: Changes? :No   Allergies: Amoxicillin, Penicillins, Dextromethorphan, and Phenylephrine  Current Medications: Current Medications[1] Medication Side Effects: none  Family Medical/ Social History: Changes? No  MENTAL HEALTH EXAM:  There were no vitals taken for this visit.There is no height or weight on file to calculate BMI.  General Appearance: Casual  Eye Contact:  Good  Speech:  Clear and Coherent  Volume:  Normal  Mood:  NA  Affect:  Appropriate  Thought Process:  Coherent  Orientation:  Full (Time, Place, and Person)  Thought Content: Logical   Suicidal Thoughts:  No  Homicidal Thoughts:  No  Memory:  WNL  Judgement:  Good  Insight:  Good  Psychomotor Activity:  Normal  Concentration:  Concentration: Good  Recall:  Good  Fund of Knowledge:  Good  Language: Good  Assets:  Desire for Improvement  ADL's:  Intact  Cognition: WNL  Prognosis:  Good    DIAGNOSES:    ICD-10-CM   1. Generalized anxiety disorder  F41.1 venlafaxine  XR (EFFEXOR -XR) 150 MG 24 hr capsule    lithium  300 MG tablet    2. Major depressive disorder, recurrent episode, moderate (HCC)  F33.1 venlafaxine  XR (EFFEXOR -XR) 150 MG 24 hr capsule    lithium  300 MG tablet    3. Suicidal ideation  R45.851 lithium  300 MG tablet    4. Obsessive-compulsive disorder, unspecified type  F42.9 lithium  300 MG tablet    5. Panic attack  F41.0       Receiving Psychotherapy: No    RECOMMENDATIONS:  Continue Ativan  1 mg only for severe anxiety or panic.  Continue NAC 1200 mg daily  To continue on Effexor  150 mg daily Continue Lithium  600  mg daily at bedtime Continue Inderal  20 mg twice daily as needed. Will report worsening symptoms  or side effects promptly. Need lithium  levels next visit. Follow up in 5 months to reassess while on winter break  Recommended she obtain psychotherapy locally. Reinforced after hours emergency number and suicide hotline.    Greater than 50% of 30 min face to face visit time with patient was spent on counseling and coordination of care. Discussed her continued good stability since last visit.  Doing very well in school and enjoying a new relationship.  Discussed potential benefits, risk, and  side effects of benzodiazepines to include potential risk of tolerance and dependence, as well as possible drowsiness.  Advised patient not to drive if experiencing drowsiness and to take lowest possible effective dose to minimize risk of dependence and tolerance.  She verbally contracted for safety..  To have lithium  levels check at school clinic New IUD, Having periods. Reinforced risk of medication and pregnancy Reviewed PDMP      Redell DELENA Pizza, NP     [1]  Current Outpatient Medications:    Levonorgestrel (KYLEENA) 19.5 MG IUD, Kyleena  17.5 mcg/24 hrs (99yrs) 19.5mg  intrauterine device  Take 1 device by intrauterine route., Disp: , Rfl:    lithium  300 MG tablet, Take 1 tablet (300 mg total) by mouth 2 (two) times daily., Disp: 180 tablet, Rfl: 1   LORazepam  (ATIVAN ) 1 MG tablet, Take one tablet by mouth daily only for severe anxiety or panic attacks., Disp: 30 tablet, Rfl: 0   NORGESTIMATE-ETH ESTRADIOL PO, Take by mouth., Disp: , Rfl:    ondansetron  (ZOFRAN ) 4 MG tablet, Take 1 tablet (4 mg total) by mouth every 6 (six) hours., Disp: 12 tablet, Rfl: 0   PROAIR RESPICLICK 108 (90 Base) MCG/ACT AEPB, Inhale 2 puffs into the lungs every 4 (four) hours as needed., Disp: , Rfl:    propranolol  (INDERAL ) 20 MG tablet, Take 1 tablet (20 mg total) by mouth 3 (three) times daily., Disp: 90 tablet, Rfl: 2   propranolol  (INDERAL ) 20 MG tablet, Take 1 tablet (20 mg total) by mouth 3 (three) times daily., Disp: 90 tablet, Rfl: 2   SUMAtriptan (IMITREX) 100 MG tablet, sumatriptan 100 mg tablet  TAKE 1/2 1 TABLET AS NEEDED FOR MIGRAINE. MAY REPEAT IN 2 HRS AS NEEDED DO NOT EXCEED 2 IN 24 HOURSD, Disp: , Rfl:    venlafaxine  XR (EFFEXOR -XR) 150 MG 24 hr capsule, Take 1 capsule (150 mg total) by mouth daily with breakfast., Disp: 90 capsule, Rfl: 1  "

## 2024-03-03 ENCOUNTER — Other Ambulatory Visit: Payer: Self-pay | Admitting: Behavioral Health

## 2024-03-03 DIAGNOSIS — R45851 Suicidal ideations: Secondary | ICD-10-CM

## 2024-03-03 DIAGNOSIS — F429 Obsessive-compulsive disorder, unspecified: Secondary | ICD-10-CM

## 2024-03-03 DIAGNOSIS — F331 Major depressive disorder, recurrent, moderate: Secondary | ICD-10-CM

## 2024-03-03 DIAGNOSIS — F411 Generalized anxiety disorder: Secondary | ICD-10-CM

## 2024-06-17 ENCOUNTER — Ambulatory Visit: Admitting: Behavioral Health
